# Patient Record
Sex: Female | Born: 1945 | Race: White | Hispanic: No | Marital: Married | Smoking: Former smoker
Health system: Southern US, Community
[De-identification: ages and names within clinical notes are randomized; demographics above are authoritative.]

## PROBLEM LIST (undated history)

## (undated) DIAGNOSIS — K219 Gastro-esophageal reflux disease without esophagitis: Secondary | ICD-10-CM

## (undated) DIAGNOSIS — E785 Hyperlipidemia, unspecified: Secondary | ICD-10-CM

## (undated) DIAGNOSIS — I471 Supraventricular tachycardia, unspecified: Secondary | ICD-10-CM

## (undated) DIAGNOSIS — I10 Essential (primary) hypertension: Secondary | ICD-10-CM

## (undated) DIAGNOSIS — K449 Diaphragmatic hernia without obstruction or gangrene: Secondary | ICD-10-CM

## (undated) DIAGNOSIS — E039 Hypothyroidism, unspecified: Secondary | ICD-10-CM

## (undated) HISTORY — DX: Supraventricular tachycardia, unspecified: I47.10

## (undated) HISTORY — PX: ABLATION: SHX5711

## (undated) HISTORY — DX: Supraventricular tachycardia: I47.1

## (undated) HISTORY — DX: Hypothyroidism, unspecified: E03.9

## (undated) HISTORY — DX: Hyperlipidemia, unspecified: E78.5

## (undated) HISTORY — DX: Gastro-esophageal reflux disease without esophagitis: K21.9

## (undated) HISTORY — DX: Essential (primary) hypertension: I10

## (undated) HISTORY — DX: Diaphragmatic hernia without obstruction or gangrene: K44.9

## (undated) HISTORY — PX: OTHER SURGICAL HISTORY: SHX169

## (undated) HISTORY — PX: CATARACT EXTRACTION: SUR2

---

## 2008-04-22 ENCOUNTER — Encounter: Payer: Self-pay | Admitting: Family Medicine

## 2008-04-28 ENCOUNTER — Ambulatory Visit: Payer: Self-pay | Admitting: Family Medicine

## 2008-04-28 DIAGNOSIS — K219 Gastro-esophageal reflux disease without esophagitis: Secondary | ICD-10-CM | POA: Insufficient documentation

## 2008-04-28 DIAGNOSIS — I10 Essential (primary) hypertension: Secondary | ICD-10-CM | POA: Insufficient documentation

## 2008-04-28 DIAGNOSIS — E039 Hypothyroidism, unspecified: Secondary | ICD-10-CM | POA: Insufficient documentation

## 2008-04-28 DIAGNOSIS — I959 Hypotension, unspecified: Secondary | ICD-10-CM | POA: Insufficient documentation

## 2008-04-29 ENCOUNTER — Encounter: Payer: Self-pay | Admitting: Family Medicine

## 2008-04-29 LAB — CONVERTED CEMR LAB
T3, Free: 3.2 pg/mL (ref 2.3–4.2)
TSH: 8.679 microintl units/mL — ABNORMAL HIGH (ref 0.350–4.50)
Thyroglobulin Ab: 46.3 (ref 0.0–60.0)

## 2008-05-19 ENCOUNTER — Ambulatory Visit: Payer: Self-pay | Admitting: Family Medicine

## 2008-05-19 DIAGNOSIS — M5416 Radiculopathy, lumbar region: Secondary | ICD-10-CM | POA: Insufficient documentation

## 2008-06-08 ENCOUNTER — Encounter: Payer: Self-pay | Admitting: Family Medicine

## 2008-06-09 ENCOUNTER — Encounter: Payer: Self-pay | Admitting: Family Medicine

## 2008-06-16 ENCOUNTER — Ambulatory Visit: Payer: Self-pay | Admitting: Family Medicine

## 2008-06-16 DIAGNOSIS — I471 Supraventricular tachycardia: Secondary | ICD-10-CM | POA: Insufficient documentation

## 2008-08-17 ENCOUNTER — Telehealth: Payer: Self-pay | Admitting: Family Medicine

## 2008-10-05 ENCOUNTER — Ambulatory Visit: Payer: Self-pay | Admitting: Family Medicine

## 2008-10-05 DIAGNOSIS — E785 Hyperlipidemia, unspecified: Secondary | ICD-10-CM | POA: Insufficient documentation

## 2008-10-07 ENCOUNTER — Encounter: Payer: Self-pay | Admitting: Family Medicine

## 2008-11-04 ENCOUNTER — Telehealth: Payer: Self-pay | Admitting: Family Medicine

## 2008-11-05 ENCOUNTER — Encounter: Payer: Self-pay | Admitting: Family Medicine

## 2008-11-06 LAB — CONVERTED CEMR LAB: LDL Cholesterol: 178 mg/dL — ABNORMAL HIGH (ref 0–99)

## 2008-12-23 ENCOUNTER — Ambulatory Visit: Payer: Self-pay | Admitting: Family Medicine

## 2009-04-20 ENCOUNTER — Ambulatory Visit: Payer: Self-pay | Admitting: Family Medicine

## 2009-04-20 LAB — CONVERTED CEMR LAB
Glucose, Urine, Semiquant: NEGATIVE
Ketones, urine, test strip: NEGATIVE
Urobilinogen, UA: 0.2

## 2009-04-21 ENCOUNTER — Encounter: Payer: Self-pay | Admitting: Family Medicine

## 2009-04-22 LAB — CONVERTED CEMR LAB
HDL: 56 mg/dL (ref >39)
Triglycerides: 231 mg/dL — ABNORMAL HIGH (ref <150)

## 2009-05-13 ENCOUNTER — Telehealth: Payer: Self-pay | Admitting: Family Medicine

## 2009-11-08 ENCOUNTER — Encounter: Payer: Self-pay | Admitting: Family Medicine

## 2009-12-13 ENCOUNTER — Ambulatory Visit: Payer: Self-pay | Admitting: Family Medicine

## 2010-01-05 ENCOUNTER — Ambulatory Visit: Payer: Self-pay | Admitting: Family Medicine

## 2010-01-06 LAB — CONVERTED CEMR LAB
CO2: 22 meq/L (ref 19–32)
Cholesterol: 204 mg/dL — ABNORMAL HIGH (ref 0–200)
Creatinine, Ser: 0.78 mg/dL (ref 0.40–1.20)
Glucose, Bld: 104 mg/dL — ABNORMAL HIGH (ref 70–99)
Sodium: 141 meq/L (ref 135–145)
Total Bilirubin: 0.5 mg/dL (ref 0.3–1.2)
Total Protein: 7.5 g/dL (ref 6.0–8.3)
Triglycerides: 167 mg/dL — ABNORMAL HIGH (ref <150)
VLDL: 33 mg/dL (ref 0–40)

## 2010-02-16 ENCOUNTER — Ambulatory Visit: Payer: Self-pay | Admitting: Family Medicine

## 2010-03-15 ENCOUNTER — Telehealth: Payer: Self-pay | Admitting: Family Medicine

## 2010-04-12 NOTE — Assessment & Plan Note (Signed)
Summary: shoulder pain, labs, HTN   Vital Signs:  Patient profile:   65 year old female Height:      58.4 inches Weight:      110 pounds Pulse rate:   55 / minute BP sitting:   158 / 66  (right arm) Cuff size:   regular  Vitals Entered By: Avon Gully CMA, Duncan Dull) (January 05, 2010 8:34 AM) CC: left shoulder pain x 2 weeks,sometimes hand gets numb,wants fasting bld work   Primary Care Provider:  [name] MD  CC:  left shoulder pain x 2 weeks, sometimes hand gets numb, and wants fasting bld work.  History of Present Illness: Left shoulder pain x 2 week, over the outside of the shoulder. Occ radiates towards neck or down the arm. Achy pain. 5-8/10. Worse if reaches back or upwards. When radiates down the arm her fingers feel numb and tingling.  Doesn't necessarrily wake up with the pain.  Pain is intermittant. Using naproxen- helps some.  No other tx.  She is worried this is a cholesterol problem. Not painful to sleep on that shoulder. No inciting events or trauma.   Cough, tickle in the back of the throat. Dry. NO other URI sxs. Occurs radomly.   Current Medications (verified): 1)  Nabumetone 750 Mg Tabs (Nabumetone) .... Take 1/2 Tablet By Mouth Once A Day 2)  Naproxen 500 Mg Tabs (Naproxen) .... Take 1 Tablet By Mouth Two Times A Day As Needed Pain 3)  Levothroid 50 Mcg Tabs (Levothyroxine Sodium) .... Take 1 Tablet By Mouth Once A Day in The Am, 1 Hour Before Breakfast. 4)  Omeprazole 40 Mg Cpdr (Omeprazole) .... Take 1 Tablet By Mouth Once A Day 5)  Aspirin 81 Mg Tbec (Aspirin) .... Take One Tablet By Mouth Once A Day 6)  Niaspan 1000 Mg Cr-Tabs (Niacin (Antihyperlipidemic)) .... Take 1 Tablet By Mouth Once A Day At Bedtime 7)  Digoxin 0.25 Mg Tabs (Digoxin) .... Take One-Half  Tablet By Mouth Once A Day 8)  Lisinopril 10 Mg Tabs (Lisinopril) .... Take 1 Tablet By Mouth Once A Day 9)  Simvastatin 40 Mg Tabs (Simvastatin) .... 1/2 Tab By Mouth At Bedtime, Then  Increase To Whole Tab.  Allergies (verified): 1)  Pravachol (Pravastatin Sodium)  Comments:  Nurse/Medical Assistant: The patient's medications and allergies were reviewed with the patient and were updated in the Medication and Allergy Lists. Avon Gully CMA, Duncan Dull) (January 05, 2010 8:36 AM)  Physical Exam  General:  Well-developed,well-nourished,in no acute distress; alert,appropriate and cooperative throughout examination Lungs:  Normal respiratory effort, chest expands symmetrically. Lungs are clear to auscultation, no crackles or wheezes. Heart:  Normal rate and regular rhythm. S1 and S2 normal without gallop, murmur, click, rub or other extra sounds. Msk:  Left shoulder with Nrom, BUT does have pain with full extension and reaching behind her back. Shoulder, elbow, wrist and thumb gsterngth 5/5 bilat.  Nontender elbow. TEnder over the biceps tendon and over tht superior edge of the clavicle and over the upper trap towards the neck.  Neck with NROM except dec extension.     Impression & Recommendations:  Problem # 1:  SHOULDER PAIN (ICD-719.41) Assessment New Conservative tx for possible bursitis for OA flare.  NSAID, icing, avoid heavy lifting. H.O given on exerciess to do.  Consider may be coming from the cervical spine since does have occ tinlgin in her hand.  The following medications were removed from the medication list:    Flexeril 10  Mg Tabs (Cyclobenzaprine hcl) .Marland Kitchen... Take 1 tablet by mouth once a day at bedtime as needed Her updated medication list for this problem includes:    Nabumetone 750 Mg Tabs (Nabumetone) .Marland Kitchen... Take 1/2 tablet by mouth once a day    Naproxen 500 Mg Tabs (Naproxen) .Marland Kitchen... Take 1 tablet by mouth two times a day as needed pain    Aspirin 81 Mg Tbec (Aspirin) .Marland Kitchen... Take one tablet by mouth once a day  Problem # 2:  ESSENTIAL HYPERTENSION, BENIGN (ICD-401.1) Assessment: Deteriorated Suspect an ACE cough so will change ACE to ARB. She will start  with half and inc to whole if her SBP is not under 140. F/U to recheck BP in 6 weeks and to make sure cough has resolved.  The following medications were removed from the medication list:    Diltiazem Hcl Er Beads 360 Mg Xr24h-cap (Diltiazem hcl er beads) .Marland Kitchen... Take one tablet by mouth once a day Her updated medication list for this problem includes:    Losartan Potassium 50 Mg Tabs (Losartan potassium) .Marland Kitchen... Take 1 tablet by mouth once a day  Orders: T-Lipid Profile ([id number]) T-Comprehensive Metabolic Panel ([id number])  Complete Medication List: 1)  Nabumetone 750 Mg Tabs (Nabumetone) .... Take 1/2 tablet by mouth once a day 2)  Naproxen 500 Mg Tabs (Naproxen) .... Take 1 tablet by mouth two times a day as needed pain 3)  Levothroid 50 Mcg Tabs (Levothyroxine sodium) .... Take 1 tablet by mouth once a day in the am, 1 hour before breakfast. 4)  Omeprazole 40 Mg Cpdr (Omeprazole) .... Take 1 tablet by mouth once a day 5)  Aspirin 81 Mg Tbec (Aspirin) .... Take one tablet by mouth once a day 6)  Niaspan 1000 Mg Cr-tabs (Niacin (antihyperlipidemic)) .... Take 1 tablet by mouth once a day at bedtime 7)  Digoxin 0.25 Mg Tabs (Digoxin) .... Take one-half  tablet by mouth once a day 8)  Losartan Potassium 50 Mg Tabs (Losartan potassium) .... Take 1 tablet by mouth once a day 9)  Simvastatin 40 Mg Tabs (Simvastatin) .... 1/2 tab by mouth at bedtime, then increase to whole tab.  Other Orders: T-TSH [id number])  Patient Instructions: 1)  Exercises for your shoulder and continue the naproxen two times a day with food for your shoulder.  If not better in 2-3 weeks then let me know.  2)  Can start the losartan 1/2 tab daily.  3)  Please schedule a follow-up appointment in 6 weeks for blood pressure.  Prescriptions: LOSARTAN POTASSIUM 50 MG TABS (LOSARTAN POTASSIUM) Take 1 tablet by mouth once a day  #30 x 1   Entered and Authorized by:   [name] MD   Signed by:   [name] MD on 01/05/2010   Method used:   Electronically to        [facility] [phone]* (retail)       [address] [place], [place]  [address]       Ph: [phone]       Fax: [phone]   RxID:   [id number] LOSARTAN POTASSIUM 25 MG TABS (LOSARTAN POTASSIUM) Take 1 tablet by mouth once a day  #30 x 2   Entered and Authorized by:   [name] MD   Signed by:   [name] MD on 01/05/2010   Method used:   Electronically to        [facility] [phone]* (  retail)       [address] [place], [place]  [address]       Ph: [phone]       Fax: [phone]   RxID:   [id number]    Orders Added: 1)  T-Lipid Profile [id number] 2)  T-Comprehensive Metabolic Panel [[id number]] 3)  T-TSH [[id number]] 4)  Est. Patient Level IV [34742]

## 2010-04-12 NOTE — Progress Notes (Signed)
Summary: APPT CHANGE?  Phone Note Call from Patient   Summary of Call: Hi Dr Linford Arnold, pt Jessica Dawson [phone]) called, she said at her last appt you told her that if she made an appt with her cardiologist that she would not need to keep her March 8th appt with you. She has made an appt with the cardiologist for March 29th. Does she still need to keep her 3/8 appt? She is still taking 1/2 of her pill and has been monitoring her blood pressure. All of her readings have been good, this mornings' 126 64 but they've been averaging 105 44. Thanks,Diane  Follow-up for Phone Call        Since BPs have been OK, then can wait until see cards.  Follow-up by: [name] MD,  May 13, 2009 9:44 AM  Additional Follow-up for Phone Call Additional follow up Details #1::        Pt informed of above.  Appt canceled.  Additional Follow-up by: Lannette Donath,  May 14, 2009 12:52 PM

## 2010-04-12 NOTE — Assessment & Plan Note (Signed)
Summary: FLU SHOT  Nurse Visit   Allergies: 1)  Pravachol (Pravastatin Sodium)  Immunizations Administered:  Influenza Vaccine # 1:    Vaccine Type: Fluvax 3+    Site: left deltoid    Mfr: Gl[employer]    Dose: 0.5 ml    Route: IM    Given by: Sue Lush McCrimmon CMA, (AAMA)    Exp. Date: 09/10/2010    Lot #: [id number]    VIS given: 10/05/09 version given December 13, 2009.  Flu Vaccine Consent Questions:    Do you have a history of severe allergic reactions to this vaccine? no    Any prior history of allergic reactions to egg and/or gelatin? no    Do you have a sensitivity to the preservative Thimersol? no    Do you have a past history of Guillan-Barre Syndrome? no    Do you currently have an acute febrile illness? no    Have you ever had a severe reaction to latex? no    Vaccine information given and explained to patient? no    Are you currently pregnant? no  Orders Added: 1)  Flu Vaccine 107yrs + [90658] 2)  Admin 1st Vaccine [44010]

## 2010-04-12 NOTE — Letter (Signed)
Summary: [facility]   Imported By: Lanelle Bal 11/18/2009 09:40:23  _____________________________________________________________________  External Attachment:    Type:   Image     Comment:   External Document

## 2010-04-12 NOTE — Assessment & Plan Note (Signed)
Summary: HTN, dysuria   Vital Signs:  Patient profile:   65 year old female Height:      58.4 inches Weight:      114.25 pounds BMI:     23.64 Temp:     97.7 degrees F oral Pulse rate:   69 / minute BP sitting:   165 / 71  Vitals Entered By: Kandice Hams (April 20, 2009 10:09 AM) CC: FOLLOWUP;  PT C/O STRONG URINE, Hypertension Management   Primary Care Provider:  [name] MD  CC:  FOLLOWUP;  PT C/O STRONG URINE and Hypertension Management.  History of Present Illness: FOLLOWUP HTN;  PT C/O STRONG URINE.  Urine wtih strong odor and dark color for about a month. No dysuria or hematuria.  No fever or back pain.   Hypertension History:      She denies headache, chest pain, palpitations, dyspnea with exertion, orthopnea, PND, peripheral edema, visual symptoms, neurologic problems, syncope, and side effects from treatment.  Headaches are much better on half a dose of digoxin. Saw her cardiologist about a month ago and he decreased her digoxin. Marland Kitchen        Positive major cardiovascular risk factors include female age 79 years old or older, hyperlipidemia, and hypertension.  Negative major cardiovascular risk factors include negative family history for ischemic heart disease and non-tobacco-user status.     Allergies: 1)  Pravachol (Pravastatin Sodium)  Physical Exam  General:  Well-developed,well-nourished,in no acute distress; alert,appropriate and cooperative throughout examination Head:  Normocephalic and atraumatic without obvious abnormalities. No apparent alopecia or balding. Eyes:  No corneal or conjunctival inflammation noted. EOMI. Perrla. Lungs:  Normal respiratory effort, chest expands symmetrically. Lungs are clear to auscultation, no crackles or wheezes. Heart:  Normal rate and regular rhythm. S1 and S2 normal without gallop, murmur, click, rub or other extra sounds.  No carotid bruits.  Skin:  no rashes.   Cervical Nodes:  No lymphadenopathy noted Psych:   Cognition and judgment appear intact. Alert and cooperative with normal attention span and concentration. No apparent delusions, illusions, hallucinations   Impression & Recommendations:  Problem # 1:  ESSENTIAL HYPERTENSION, BENIGN (ICD-401.1) Will add an ACEi to her regimen for better control. Warned of potential SE. Call if any concerns. Says her BPs are always high at the MD office.  Check BP at home and bring in reading in one month.  Her updated medication list for this problem includes:    Diltiazem Hcl Er Beads 360 Mg Xr24h-cap (Diltiazem hcl er beads) .Marland Kitchen... Take one tablet by mouth once a day    Lisinopril 10 Mg Tabs (Lisinopril) .Marland Kitchen... Take 1 tablet by mouth once a day  Problem # 2:  DYSURIA (ICD-788.1) UA on ly + for LE. Will send cx and hold off an ABX for now. Needs to increase fluds. It really sounds like she doesn't drink much at all during the day.  Orders: T-Urine Culture (Spectrum Order) [id number])  Complete Medication List: 1)  Nabumetone 750 Mg Tabs (Nabumetone) .... Take 1/2 tablet by mouth once a day 2)  Flexeril 10 Mg Tabs (Cyclobenzaprine hcl) .... Take 1 tablet by mouth once a day at bedtime as needed 3)  Naproxen 500 Mg Tabs (Naproxen) .... Take 1 tablet by mouth two times a day as needed pain 4)  Levothroid 50 Mcg Tabs (Levothyroxine sodium) .... Take 1 tablet by mouth once a day in the am, 1 hour before breakfast. 5)  Omeprazole 40 Mg Cpdr (Omeprazole) .Marland KitchenMarland KitchenMarland Kitchen  Take 1 tablet by mouth once a day 6)  Aspirin 81 Mg Tbec (Aspirin) .... Take one tablet by mouth once a day 7)  Niaspan 1000 Mg Cr-tabs (Niacin (antihyperlipidemic)) .... Take 1 tablet by mouth once a day at bedtime 8)  Digoxin 0.25 Mg Tabs (Digoxin) .... Take one-half  tablet by mouth once a day 9)  Diltiazem Hcl Er Beads 360 Mg Xr24h-cap (Diltiazem hcl er beads) .... Take one tablet by mouth once a day 10)  Lisinopril 10 Mg Tabs (Lisinopril) .... Take 1 tablet by mouth once a day  Other  Orders: Prescription Created Electronically [id number]) T-Lipid Profile [id number])  Hypertension Assessment/Plan:      The patient's hypertensive risk group is category B: At least one risk factor (excluding diabetes) with no target organ damage.  Her calculated 10 year risk of coronary heart disease is 27 %.  Today's blood pressure is 165/71.  Her blood pressure goal is < 140/90. Prescriptions: LISINOPRIL 10 MG TABS (LISINOPRIL) Take 1 tablet by mouth once a day  #30 x 1   Entered and Authorized by:   [name] MD   Signed by:   [name] MD on 04/20/2009   Method used:   Electronically to        [facility] [phone]* (retail)       [address] [place], [place]  [address]       Ph: [phone]       Fax: [phone]   RxID:   [id number]   Laboratory Results   Urine Tests    Routine Urinalysis   Color: yellow Appearance: Clear Glucose: negative   (Normal Range: Negative) Bilirubin: negative   (Normal Range: Negative) Ketone: negative   (Normal Range: Negative) Spec. Gravity: 1.015   (Normal Range: 1.003-1.035) Blood: trace-intact   (Normal Range: Negative) pH: 7.5   (Normal Range: 5.0-8.0) Protein: negative   (Normal Range: Negative) Urobilinogen: 0.2   (Normal Range: 0-1) Nitrite: negative   (Normal Range: Negative) Leukocyte Esterace: trace   (Normal Range: Negative)

## 2010-04-12 NOTE — Assessment & Plan Note (Signed)
Summary: 6 week f/u HTN, lipids, UR   Vital Signs:  Patient profile:   65 year old female Height:      58.4 inches Weight:      114 pounds Pulse rate:   63 / minute BP sitting:   153 / 69  (right arm) Cuff size:   regular  Vitals Entered By: Avon Gully CMA, Duncan Dull) (February 16, 2010 8:19 AM) CC: f/u BP, Hypertension Management   Primary Care Provider:  [name] MD  CC:  f/u BP and Hypertension Management.  History of Present Illness: Overall cough is better but did get a cold 2 weeks ago. Has sinus congestion adn left ear pain. Overall feels better but wants me to check her ear. NO fever  Left shoulder is better.   Hypertension History:      She denies headache, chest pain, palpitations, dyspnea with exertion, orthopnea, PND, peripheral edema, visual symptoms, neurologic problems, syncope, and side effects from treatment.  She notes no problems with any antihypertensive medication side effects.        Positive major cardiovascular risk factors include female age 46 years old or older, hyperlipidemia, and hypertension.  Negative major cardiovascular risk factors include negative family history for ischemic heart disease and non-tobacco-user status.     Current Medications (verified): 1)  Naproxen 500 Mg Tabs (Naproxen) .... Take 1 Tablet By Mouth Two Times A Day As Needed Pain 2)  Levothroid 50 Mcg Tabs (Levothyroxine Sodium) .... Take 1 Tab By Mouth Once Daily 3)  Omeprazole 40 Mg Cpdr (Omeprazole) .... Take 1 Tablet By Mouth Once A Day 4)  Aspirin 81 Mg Tbec (Aspirin) .... Take One Tablet By Mouth Once A Day 5)  Digoxin 0.25 Mg Tabs (Digoxin) .... Take One-Half  Tablet By Mouth Once A Day 6)  Losartan Potassium 50 Mg Tabs (Losartan Potassium) .... Take 1 Tablet By Mouth Once A Day 7)  Simvastatin 40 Mg Tabs (Simvastatin) .... 1/2 Tab By Mouth At Bedtime, Then Increase To Whole Tab.  Allergies (verified): 1)  Pravachol (Pravastatin  Sodium)  Comments:  Nurse/Medical Assistant: The patient's medications and allergies were reviewed with the patient and were updated in the Medication and Allergy Lists. Avon Gully CMA, Duncan Dull) (February 16, 2010 8:20 AM)  Physical Exam  General:  Well-developed,well-nourished,in no acute distress; alert,appropriate and cooperative throughout examination Head:  Normocephalic and atraumatic without obvious abnormalities. No apparent alopecia or balding. Eyes:  No corneal or conjunctival inflammation noted. EOMI. Perrla. Ears:  External ear exam shows no significant lesions or deformities.  Otoscopic examination reveals clear canals, tympanic membranes are intact bilaterally without bulging, retraction, inflammation or discharge. Hearing is grossly normal bilaterally. Nose:  External nasal examination shows no deformity or inflammation. Nasal mucosa are pink and moist without lesions or exudates. Mouth:  Oral mucosa and oropharynx without lesions or exudates.  Teeth in good repair. Neck:  No deformities, masses, or tenderness noted. Lungs:  Normal respiratory effort, chest expands symmetrically. Lungs are clear to auscultation, no crackles or wheezes. Heart:  Normal rate and regular rhythm. S1 and S2 normal without gallop, murmur, click, rub or other extra sounds. Skin:  no rashes.   Cervical Nodes:  No lymphadenopathy noted Psych:  Cognition and judgment appear intact. Alert and cooperative with normal attention span and concentration. No apparent delusions, illusions, hallucinations   Impression & Recommendations:  Problem # 1:  ESSENTIAL HYPERTENSION, BENIGN (ICD-401.1) Cough is much improved off the ACE.  INcrease the losartan to  whole tab. F./U to recheck pressure in 2 months.  Her updated medication list for this problem includes:    Losartan Potassium 50 Mg Tabs (Losartan potassium) .Marland Kitchen... Take 1 tablet by mouth once a day  Problem # 2:  URI (ICD-465.9) Assessment:  Deteriorated REsolving. Exam is normal.  The following medications were removed from the medication list:    Nabumetone 750 Mg Tabs (Nabumetone) .Marland Kitchen... Take 1/2 tablet by mouth once a day Her updated medication list for this problem includes:    Naproxen 500 Mg Tabs (Naproxen) .Marland Kitchen... Take 1 tablet by mouth two times a day as needed pain    Aspirin 81 Mg Tbec (Aspirin) .Marland Kitchen... Take one tablet by mouth once a day  Problem # 3:  HYPERLIPIDEMIA (ICD-272.4) Will change to lipitor for better conrol. Await to Jan.  The following medications were removed from the medication list:    Niaspan 1000 Mg Cr-tabs (Niacin (antihyperlipidemic)) .Marland Kitchen... Take 1 tablet by mouth once a day at bedtime Her updated medication list for this problem includes:    Lipitor 40 Mg Tabs (Atorvastatin calcium) .Marland Kitchen... Take 1 tablet by mouth once a day at bedtime. please fill afterjan 1 2011  Complete Medication List: 1)  Naproxen 500 Mg Tabs (Naproxen) .... Take 1 tablet by mouth two times a day as needed pain 2)  Levothroid 50 Mcg Tabs (Levothyroxine sodium) .... Take 1 tab by mouth once daily 3)  Omeprazole 40 Mg Cpdr (Omeprazole) .... Take 1 tablet by mouth once a day 4)  Aspirin 81 Mg Tbec (Aspirin) .... Take one tablet by mouth once a day 5)  Digoxin 0.25 Mg Tabs (Digoxin) .... Take one-half  tablet by mouth once a day 6)  Losartan Potassium 50 Mg Tabs (Losartan potassium) .... Take 1 tablet by mouth once a day 7)  Lipitor 40 Mg Tabs (Atorvastatin calcium) .... Take 1 tablet by mouth once a day at bedtime. please fill afterjan 1 2011  Hypertension Assessment/Plan:      The patient's hypertensive risk group is category B: At least one risk factor (excluding diabetes) with no target organ damage.  Her calculated 10 year risk of coronary heart disease is 13 %.  Today's blood pressure is 153/69.  Her blood pressure goal is < 140/90.  Patient Instructions: 1)  Please schedule a follow-up appointment in 2 months to follow up  blood pessure and cholesterol.  Prescriptions: LIPITOR 40 MG TABS (ATORVASTATIN CALCIUM) Take 1 tablet by mouth once a day at bedtime. Please fill afterJan 1 2011  #30 x 2   Entered and Authorized by:   [name] MD   Signed by:   [name] MD on 02/16/2010   Method used:   Electronically to        [facility] [phone]* (retail)       [address] [place], [place]  [address]       Ph: [phone]       Fax: [phone]   RxID:   [id number] SIMVASTATIN 40 MG TABS (SIMVASTATIN) Take 1 tablet by mouth once a day  #90 x 1   Entered and Authorized by:   [name] MD   Signed by:   [name] MD on 02/16/2010   Method used:   Electronically to        [facility] [phone]* (retail)       [address] [place],  [place]  [address]       Ph: [phone]       Fax: [phone]   RxID:   [id number] VOZDGUYQ POTASSIUM 50 MG TABS (LOSARTAN POTASSIUM) Take 1 tablet by mouth once a day  #30 x 1   Entered and Authorized by:   [name] MD   Signed by:   [name] MD on 02/16/2010   Method used:   Electronically to        [facility] [phone]* (retail)       [address] [place], [place]  [address]       Ph: [phone]       Fax: [phone]   RxID:   [id number]    Orders Added: 1)  Est. Patient Level III [22025]

## 2010-04-14 NOTE — Progress Notes (Signed)
Summary: medicine  Phone Note Call from Patient Call back at Home Phone [phone]   Caller: Patient Summary of Call: Pt. called today and left a message stating that Dr. Linford Arnold told her she was going to call her in Lipitor or some kind of Cholesterol med and then when her husband went to get the med, it has not been called in.Marland KitchenMarland KitchenMarland KitchenPlease call her and let her know the status on this.Michaelle Copas  March 15, 2010 1:41 PM  Initial call taken by: Michaelle Copas,  March 15, 2010 1:41 PM  Follow-up for Phone Call        pt notified Follow-up by: Avon Gully CMA, Duncan Dull),  March 15, 2010 1:47 PM

## 2010-05-03 ENCOUNTER — Encounter: Payer: Self-pay | Admitting: Family Medicine

## 2010-05-17 ENCOUNTER — Encounter: Payer: Self-pay | Admitting: Family Medicine

## 2010-05-17 ENCOUNTER — Ambulatory Visit (INDEPENDENT_AMBULATORY_CARE_PROVIDER_SITE_OTHER): Payer: Medicare Other | Admitting: Family Medicine

## 2010-05-17 DIAGNOSIS — E785 Hyperlipidemia, unspecified: Secondary | ICD-10-CM

## 2010-05-17 DIAGNOSIS — E039 Hypothyroidism, unspecified: Secondary | ICD-10-CM

## 2010-05-17 DIAGNOSIS — I10 Essential (primary) hypertension: Secondary | ICD-10-CM

## 2010-05-18 ENCOUNTER — Encounter: Payer: Self-pay | Admitting: Family Medicine

## 2010-05-18 LAB — CONVERTED CEMR LAB
Albumin: 5.2 g/dL (ref 3.5–5.2)
Alkaline Phosphatase: 78 units/L (ref 39–117)
BUN: 16 mg/dL (ref 6–23)
CO2: 25 meq/L (ref 19–32)
Calcium: 10.2 mg/dL (ref 8.4–10.5)
Cholesterol: 204 mg/dL — ABNORMAL HIGH (ref 0–200)
Glucose, Bld: 105 mg/dL — ABNORMAL HIGH (ref 70–99)
HDL: 51 mg/dL (ref >39)
LDL Cholesterol: 111 mg/dL — ABNORMAL HIGH (ref 0–99)
Potassium: 4.7 meq/L (ref 3.5–5.3)
Triglycerides: 208 mg/dL — ABNORMAL HIGH (ref <150)

## 2010-05-24 NOTE — Assessment & Plan Note (Signed)
Summary: f/up- HTN, lipids   Vital Signs:  Patient profile:   65 year old female Height:      58.4 inches Weight:      113 pounds Pulse rate:   62 / minute BP sitting:   162 / 73  (right arm) Cuff size:   regular  Vitals Entered By: Avon Gully CMA, Duncan Dull) (May 17, 2010 9:13 AM) CC: f/u BP ,check toenails   Primary Care Provider:  [name] MD  CC:  f/u BP  and check toenails.  History of Present Illness: F/U BP.  HOme BPs 107-143/46-60.  No dizziness. No HA, noausea nd vomiting. Husband is in the hospital today. Says took her BP later today than usual.   NO weight gain or fatigue or skin changes.   Current Medications (verified): 1)  Naproxen 500 Mg Tabs (Naproxen) .... Take 1 Tablet By Mouth Two Times A Day As Needed Pain 2)  Levothroid 50 Mcg Tabs (Levothyroxine Sodium) .... Take 1 Tab By Mouth Once Daily 3)  Omeprazole 40 Mg Cpdr (Omeprazole) .... Take 1 Tablet By Mouth Once A Day 4)  Aspirin 81 Mg Tbec (Aspirin) .... Take One Tablet By Mouth Once A Day 5)  Digoxin 0.25 Mg Tabs (Digoxin) .... Take One-Half  Tablet By Mouth Once A Day 6)  Losartan Potassium 50 Mg Tabs (Losartan Potassium) .... Take 1 Tablet By Mouth Once A Day 7)  Lipitor 40 Mg Tabs (Atorvastatin Calcium) .... Take 1 Tablet By Mouth Once A Day At Bedtime. Please Fill Afterjan 1 2011 8)  Taztia Xt 180 Mg Xr24h-Cap (Diltiazem Hcl Er Beads) .... Take One Tablet By Mouth Two Times A Day  Allergies (verified): 1)  Pravachol (Pravastatin Sodium)  Comments:  Nurse/Medical Assistant: The patient's medications and allergies were reviewed with the patient and were updated in the Medication and Allergy Lists. 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  Married to [employer].  Former Smoker Alcohol use-no Drug use-no Regular exercise-yes  Physical Exam  General:  Well-developed,well-nourished,in no acute distress;  alert,appropriate and cooperative throughout examination Lungs:  Normal respiratory effort, chest expands symmetrically. Lungs are clear to auscultation, no crackles or wheezes. Heart:  Normal rate and regular rhythm. S1 and S2 normal without gallop, murmur, click, rub or other extra sounds.   Impression & Recommendations:  Problem # 1:  ESSENTIAL HYPERTENSION, BENIGN (ICD-401.1) Up today but home BPs look well controlled and we have measured her machine against our and it is accurate.  Her updated medication list for this problem includes:    Losartan Potassium 50 Mg Tabs (Losartan potassium) .Marland Kitchen... Take 1 tablet by mouth once a day    Taztia Xt 180 Mg Xr24h-cap (Diltiazem hcl er beads) .Marland Kitchen... Take one tablet by mouth two times a day  BP today: 162/73 Prior BP: 153/69 (02/16/2010)  Prior 10 Yr Risk Heart Disease: 13 % (02/16/2010)  Labs Reviewed: K+: 5.2 (01/05/2010) Creat: : 0.78 (01/05/2010)   Chol: 204 (01/05/2010)   HDL: 55 (01/05/2010)   LDL: 116 (01/05/2010)   TG: 167 (01/05/2010)  Orders: T-Comprehensive Metabolic Panel ([id number])  Problem # 2:  UNSPECIFIED HYPOTHYROIDISM (ICD-244.9) Due to recheck level and make sure at goal.   Her updated medication list for this problem includes:    Levothroid 50 Mcg Tabs (Levothyroxine sodium) .Marland Kitchen... Take 1 tab by mouth once daily  Labs Reviewed: TSH: 3.257 (01/05/2010)    Chol: 204 (01/05/2010)  HDL: 55 (01/05/2010)   LDL: 116 (01/05/2010)   TG: 167 (01/05/2010)  Orders: T-TSH ([id number])  Problem # 3:  HYPERLIPIDEMIA (ICD-272.4) Due to recheck on the lipitor to make sure at goal.  She is only taking a half a tab for cost savings.  Her updated medication list for this problem includes:    Lipitor 40 Mg Tabs (Atorvastatin calcium) .Marland Kitchen... Take 1 tablet by mouth once a day at bedtime. please fill afterjan 1 2011  Orders: T-Lipid Profile ([id number])  Complete Medication List: 1)  Naproxen 500 Mg Tabs (Naproxen) .... Take 1  tablet by mouth two times a day as needed pain 2)  Levothroid 50 Mcg Tabs (Levothyroxine sodium) .... Take 1 tab by mouth once daily 3)  Omeprazole 40 Mg Cpdr (Omeprazole) .... Take 1 tablet by mouth once a day 4)  Aspirin 81 Mg Tbec (Aspirin) .... Take one tablet by mouth once a day 5)  Digoxin 0.25 Mg Tabs (Digoxin) .... Take one-half  tablet by mouth once a day 6)  Losartan Potassium 50 Mg Tabs (Losartan potassium) .... Take 1 tablet by mouth once a day 7)  Lipitor 40 Mg Tabs (Atorvastatin calcium) .... Take 1 tablet by mouth once a day at bedtime. please fill afterjan 1 2011 8)  Taztia Xt 180 Mg Xr24h-cap (Diltiazem hcl er beads) .... Take one tablet by mouth two times a day  Patient Instructions: 1)  Please schedule a follow-up appointment in 4 months for blood pressure .    Orders Added: 1)  T-Comprehensive Metabolic Panel [[id number]] 2)  T-TSH [[id number]] 3)  T-Lipid Profile [[id number]] 4)  Est. Patient Level III [03474]

## 2010-05-31 NOTE — Letter (Signed)
Summary: [facility]   Imported By: Maryln Gottron 05/24/2010 12:44:20  _____________________________________________________________________  External Attachment:    Type:   Image     Comment:   External Document

## 2010-06-14 ENCOUNTER — Other Ambulatory Visit: Payer: Self-pay | Admitting: Family Medicine

## 2010-06-14 NOTE — Telephone Encounter (Signed)
Pt last seen 05/17/10 and will be due for BP follow up in July 2012.  Is it OK to refill Naprosyn.  Please advise.

## 2010-07-12 ENCOUNTER — Other Ambulatory Visit: Payer: Self-pay | Admitting: Family Medicine

## 2010-07-20 ENCOUNTER — Other Ambulatory Visit: Payer: Self-pay | Admitting: *Deleted

## 2010-09-26 ENCOUNTER — Other Ambulatory Visit: Payer: Self-pay | Admitting: Family Medicine

## 2010-09-26 NOTE — Telephone Encounter (Signed)
Pt needs a follow up within the next 30 days per last office visit note 02-16-2010.  To follow up for BP and cholesterol.

## 2010-10-18 ENCOUNTER — Other Ambulatory Visit: Payer: Self-pay | Admitting: Family Medicine

## 2010-10-20 ENCOUNTER — Other Ambulatory Visit: Payer: Self-pay | Admitting: Family Medicine

## 2010-10-20 NOTE — Telephone Encounter (Signed)
Prescription was sent on 10-18-10 for # 30.  Pt was suppose to have followed up in Feb 2012 for HTN.  Please have pt call office and sched an office visit for HTN.  No more refills until appt satisfied.

## 2010-10-20 NOTE — Telephone Encounter (Signed)
Pt calling saying her losartan prescription is not at the pharm and she needs refilled. Plan:  Told the pt I would call the pharm and check to see what the problem is since script was sent electronically on 10-18-10.  Called [facility] and the tech says they have the script filled and ready since 10-18-10.  Pharmacist will call the patient and let her know she can pup the script. Jarvis Newcomer, LPN [facility]

## 2010-10-26 ENCOUNTER — Encounter: Payer: Self-pay | Admitting: Family Medicine

## 2010-10-31 ENCOUNTER — Ambulatory Visit (INDEPENDENT_AMBULATORY_CARE_PROVIDER_SITE_OTHER): Payer: Medicare Other | Admitting: Family Medicine

## 2010-10-31 ENCOUNTER — Other Ambulatory Visit: Payer: Self-pay | Admitting: Family Medicine

## 2010-10-31 ENCOUNTER — Encounter: Payer: Self-pay | Admitting: Family Medicine

## 2010-10-31 VITALS — BP 164/72 | HR 65 | Wt 110.0 lb

## 2010-10-31 DIAGNOSIS — Z23 Encounter for immunization: Secondary | ICD-10-CM

## 2010-10-31 DIAGNOSIS — H9209 Otalgia, unspecified ear: Secondary | ICD-10-CM

## 2010-10-31 DIAGNOSIS — K120 Recurrent oral aphthae: Secondary | ICD-10-CM

## 2010-10-31 DIAGNOSIS — I10 Essential (primary) hypertension: Secondary | ICD-10-CM

## 2010-10-31 MED ORDER — MAGIC MOUTHWASH: 5.0000 mL | Freq: Four times a day (QID) | ORAL | Status: DC

## 2010-10-31 MED ORDER — AMBULATORY NON FORMULARY MEDICATION: Status: DC

## 2010-10-31 NOTE — Patient Instructions (Signed)
Keep meds the same.

## 2010-10-31 NOTE — Progress Notes (Signed)
  Subjective:    Patient ID: Jessica Dawson, female    DOB: 1945-07-07, 65 y.o.   MRN: [id number]  Hypertension This is a chronic problem. The current episode started more than 1 year ago. The problem is controlled. Pertinent negatives include no blurred vision, chest pain or shortness of breath. There are no associated agents to hypertension. Risk factors for coronary artery disease include no known risk factors. Past treatments include angiotensin blockers and calcium channel blockers. There are no compliance problems.    Says went to the beach recently abd got water in her ear and having intermittant left ear pain.  No drainage or fever. Had a URI recently.  Then broke wout eith a fever blister on her lip and then also blister on the lef side of tongue and instead of jaw. She has had before this is the first has been several years. No known triggers except for recent URI. She does have what she has a mild nasal congestion. No cough.   Review of Systems  Eyes: Negative for blurred vision.  Respiratory: Negative for shortness of breath.   Cardiovascular: Negative for chest pain.       Objective:   Physical Exam  Constitutional: She is oriented to person, place, and time. She appears well-developed and well-nourished.  HENT:  Head: Normocephalic and atraumatic.  Right Ear: External ear normal.  Left Ear: External ear normal.  Nose: Nose normal.  Mouth/Throat: Oropharynx is clear and moist.       TMs and canals are clear.   Eyes: Conjunctivae and EOM are normal. Pupils are equal, round, and reactive to light.  Neck: Neck supple. No thyromegaly present.  Cardiovascular: Normal rate, regular rhythm and normal heart sounds.   Pulmonary/Chest: Effort normal and breath sounds normal. She has no wheezes.  Lymphadenopathy:    She has no cervical adenopathy.  Neurological: She is alert and oriented to person, place, and time.  Skin: Skin is warm and dry.  Psychiatric: She has a normal mood and  affect.          Assessment & Plan:  Ear pain-I gave her reassurance as her exam is normal today. Call if it persists or suddenly gets worse.  Canker sore-prescription given for Magic mouthwash to use. She isn't improving in the next one to 2 weeks subsequently night. Avoid acidic foods that might irritate the lesion. An avoid alcohol based mouthwash as.

## 2010-10-31 NOTE — Assessment & Plan Note (Signed)
BP is up a little today but she has noticed some BPs occ in the upper 90s SBP at home so will continue current regimen and recheck in 3 months. Discussed really watching the salt in her diet.

## 2010-11-02 ENCOUNTER — Other Ambulatory Visit: Payer: Self-pay | Admitting: *Deleted

## 2010-11-16 ENCOUNTER — Other Ambulatory Visit: Payer: Self-pay | Admitting: Family Medicine

## 2010-11-22 ENCOUNTER — Other Ambulatory Visit: Payer: Self-pay | Admitting: Family Medicine

## 2010-11-22 NOTE — Telephone Encounter (Signed)
Pt needs to call office to schedule fasting lab appt and office appt for chol.

## 2011-01-10 ENCOUNTER — Other Ambulatory Visit: Payer: Self-pay | Admitting: Family Medicine

## 2011-01-25 ENCOUNTER — Ambulatory Visit (INDEPENDENT_AMBULATORY_CARE_PROVIDER_SITE_OTHER): Payer: Medicare Other | Admitting: Family Medicine

## 2011-01-25 ENCOUNTER — Encounter: Payer: Self-pay | Admitting: Family Medicine

## 2011-01-25 VITALS — BP 155/67 | HR 57 | Wt 115.0 lb

## 2011-01-25 DIAGNOSIS — Z1231 Encounter for screening mammogram for malignant neoplasm of breast: Secondary | ICD-10-CM

## 2011-01-25 DIAGNOSIS — K1370 Unspecified lesions of oral mucosa: Secondary | ICD-10-CM

## 2011-01-25 DIAGNOSIS — K121 Other forms of stomatitis: Secondary | ICD-10-CM

## 2011-01-25 DIAGNOSIS — I10 Essential (primary) hypertension: Secondary | ICD-10-CM

## 2011-01-25 MED ORDER — LOSARTAN POTASSIUM 100 MG PO TABS: 100.0000 mg | ORAL_TABLET | Freq: Every day | ORAL | Status: DC

## 2011-01-25 MED ORDER — PREDNISONE 20 MG PO TABS: 40.0000 mg | ORAL_TABLET | Freq: Every day | ORAL | Status: DC

## 2011-01-25 NOTE — Patient Instructions (Signed)
Can also get OTC Lysine which helps with mouth ulcers.

## 2011-01-25 NOTE — Progress Notes (Signed)
  Subjective:    Patient ID: Jessica Dawson, female    DOB: 22-Jun-1945, 65 y.o.   MRN: [id number]  Hypertension This is a chronic problem. The current episode started more than 1 year ago. The problem has been gradually improving since onset. The problem is uncontrolled. Pertinent negatives include no chest pain or shortness of breath. There are no associated agents to hypertension. Past treatments include angiotensin blockers and calcium channel blockers. There are no compliance problems.   BPs have been 108-150/53-70.  Still has ulcers on the left side of tongue.  Has been there for weeks and has not cleared up yet. Not as tender. Tried the magic mouthwash and helped sooth it but not really healing.  Had similar episodes years ago.    Review of Systems  Respiratory: Negative for shortness of breath.   Cardiovascular: Negative for chest pain.       Objective:   Physical Exam  Constitutional: She is oriented to person, place, and time. She appears well-developed and well-nourished.  HENT:  Head: Normocephalic and atraumatic.       She still has a group of about 3 ulcers on the left lateral side of her tongue.   Cardiovascular: Normal rate, regular rhythm and normal heart sounds.   Pulmonary/Chest: Effort normal and breath sounds normal.  Neurological: She is alert and oriented to person, place, and time.  Skin: Skin is warm and dry.  Psychiatric: She has a normal mood and affect. Her behavior is normal.          Assessment & Plan:  HTN- reviewing her numbers from her blood pressure log which is about half of her blood pressures are well controlled at about half are running in the upper one thirty-two over one 40s. I would like her to increase her to start into 100 mg and made it to bedtime. She currently takes her Cardizem and losartan the morning. I like her to followup in 4-6 weeks to recheck her blood pressure mixture to improving. If she starts feeling lightheaded or dizzy on the  increased dose of losartan and please give the office a call.  Mouth ulcers-I really expected to have improved by now. She has been continuing to use her Listerine mouthwash. I asked her to stop this for a week or 2 and see if it helps. It could be causing a persistent irritation to the area. Also the magic mouthwash was too expensive so she has not refilled it. He did try a round of steroids to see if this improves. I will give her 5 days worth. Also she can try over-the-counter lysine as well. If her symptoms are not resolving in the next 7-10 days and please give the office a call back.  I also recommend scheduling her for a mammogram. She did decline a colonoscopy but said she would do stool cards. We gave this to her today. Also she is to check with her insurance to see if they will cover the pneumonia vaccine.

## 2011-01-31 ENCOUNTER — Other Ambulatory Visit: Payer: Self-pay | Admitting: Family Medicine

## 2011-01-31 MED ORDER — HYDROCHLOROTHIAZIDE 12.5 MG PO CAPS: 12.5000 mg | ORAL_CAPSULE | Freq: Every day | ORAL | Status: DC

## 2011-01-31 NOTE — Telephone Encounter (Signed)
Will add a low dose fluid pill in the AM.

## 2011-01-31 NOTE — Telephone Encounter (Signed)
Pt.notified

## 2011-01-31 NOTE — Telephone Encounter (Signed)
Pt called and states the prednisone did help her blisters and they are gone. BP is still running high at150/57

## 2011-02-13 ENCOUNTER — Telehealth: Payer: Self-pay | Admitting: *Deleted

## 2011-02-13 NOTE — Telephone Encounter (Signed)
Pt states the new fluid pill is not working her BP was 140/64 p 62 this am 139/62 p70

## 2011-02-13 NOTE — Telephone Encounter (Signed)
Increase to 25mg  ( 2 tabs of the 12.5mg )  adn keep follow up appt.

## 2011-02-13 NOTE — Telephone Encounter (Signed)
Pt.notified

## 2011-02-15 ENCOUNTER — Other Ambulatory Visit: Payer: Self-pay | Admitting: Family Medicine

## 2011-02-22 ENCOUNTER — Other Ambulatory Visit: Payer: Self-pay | Admitting: *Deleted

## 2011-02-22 MED ORDER — HYDROCHLOROTHIAZIDE 12.5 MG PO CAPS: 12.5000 mg | ORAL_CAPSULE | Freq: Two times a day (BID) | ORAL | Status: DC

## 2011-02-28 ENCOUNTER — Encounter: Payer: Self-pay | Admitting: Family Medicine

## 2011-03-01 ENCOUNTER — Encounter: Payer: Self-pay | Admitting: Family Medicine

## 2011-03-01 ENCOUNTER — Ambulatory Visit (INDEPENDENT_AMBULATORY_CARE_PROVIDER_SITE_OTHER): Payer: Medicare Other | Admitting: Family Medicine

## 2011-03-01 DIAGNOSIS — R202 Paresthesia of skin: Secondary | ICD-10-CM

## 2011-03-01 DIAGNOSIS — E039 Hypothyroidism, unspecified: Secondary | ICD-10-CM

## 2011-03-01 DIAGNOSIS — R209 Unspecified disturbances of skin sensation: Secondary | ICD-10-CM

## 2011-03-01 DIAGNOSIS — I10 Essential (primary) hypertension: Secondary | ICD-10-CM

## 2011-03-01 NOTE — Progress Notes (Signed)
  Subjective:    Patient ID: Jessica Dawson, female    DOB: [birth year], 65 y.o.   MRN: [id number]  HPI Hypertension-she is doing very well on her blood pressure regimen. She is taking her hydrochlorothiazide and her diltiazem in the morning. She missed her losartan to bedtime. We did increase her heart for Dyazide to 2 tabs. She takes one first thing in the morning and one around lunchtime. She has been tracking her home blood pressures. We have verified that her cuff is accurate as she has probably been before. Her home blood pressures have all been consistently running under 135. She denies any side effects of medication. She has noticed some tingling in her fingertips. She says often changes hands and is not consistent. She wonders if it could be one of the blood pressure medications.  Hyperlipidemia-she has stopped her Lipitor-she was to be repeated muscle cramping especially in her right leg that was severe. She went to her Lipitor for a month to see if it helped. She has not had any episodes since. She says she plans to restart the Lipitor to give that another try before we consider changing medication. I think this is completely reasonable. If the cramping starts again and then we can change to a different statin.   Review of Systems     Objective:   Physical Exam  Constitutional: She is oriented to person, place, and time. She appears well-developed and well-nourished.  HENT:  Head: Normocephalic and atraumatic.  Cardiovascular: Normal rate, regular rhythm and normal heart sounds.   Pulmonary/Chest: Effort normal and breath sounds normal.  Neurological: She is alert and oriented to person, place, and time.  Skin: Skin is warm and dry.  Psychiatric: She has a normal mood and affect. Her behavior is normal.          Assessment & Plan:  Hypertension-her home blood pressure readings look fantastic and we have verified her home blood pressure cuff against our machine. She does also have  whitecoat hypertension as well. We will check a BMP to make sure that her electrolytes are normal since we increased her HCTZ 25 mg. We'll also check a TSH and a B12 and a CBC since she is having numbness and tingling in her fingertips.  Hypothyroid-we are due to recheck her level today.  Hyperlipidemia-she will restart her statin and see if the cramps recur. If they do then we will try a different statin.

## 2011-03-02 ENCOUNTER — Other Ambulatory Visit: Payer: Self-pay | Admitting: Family Medicine

## 2011-03-02 LAB — CBC WITH DIFFERENTIAL/PLATELET
Basophils Absolute: 0 10*3/uL (ref 0.0–0.1)
Basophils Relative: 1 % (ref 0–1)
MCHC: 32.4 g/dL (ref 30.0–36.0)
Neutro Abs: 3.9 10*3/uL (ref 1.7–7.7)
Neutrophils Relative %: 57 % (ref 43–77)
Platelets: 387 10*3/uL (ref 150–400)
RDW: 12.8 % (ref 11.5–15.5)

## 2011-03-02 LAB — VITAMIN B12: Vitamin B-12: 1525 pg/mL — ABNORMAL HIGH (ref 211–911)

## 2011-03-02 LAB — BASIC METABOLIC PANEL WITH GFR
CO2: 26 mEq/L (ref 19–32)
Calcium: 9.9 mg/dL (ref 8.4–10.5)
GFR, Est African American: >89 mL/min
Sodium: 138 mEq/L (ref 135–145)

## 2011-03-02 LAB — TSH: TSH: 4.331 u[IU]/mL (ref 0.350–4.500)

## 2011-05-01 ENCOUNTER — Other Ambulatory Visit: Payer: Self-pay | Admitting: Family Medicine

## 2011-05-08 ENCOUNTER — Telehealth: Payer: Self-pay | Admitting: Family Medicine

## 2011-05-08 ENCOUNTER — Encounter: Payer: Self-pay | Admitting: Family Medicine

## 2011-05-08 ENCOUNTER — Ambulatory Visit (INDEPENDENT_AMBULATORY_CARE_PROVIDER_SITE_OTHER): Payer: Medicare Other | Admitting: Family Medicine

## 2011-05-08 VITALS — BP 134/73 | HR 71 | Ht 59.5 in | Wt 110.0 lb

## 2011-05-08 DIAGNOSIS — Z1331 Encounter for screening for depression: Secondary | ICD-10-CM

## 2011-05-08 DIAGNOSIS — I10 Essential (primary) hypertension: Secondary | ICD-10-CM

## 2011-05-08 DIAGNOSIS — Z9181 History of falling: Secondary | ICD-10-CM

## 2011-05-08 DIAGNOSIS — K121 Other forms of stomatitis: Secondary | ICD-10-CM

## 2011-05-08 DIAGNOSIS — K1370 Unspecified lesions of oral mucosa: Secondary | ICD-10-CM

## 2011-05-08 MED ORDER — NAPROXEN 500 MG PO TABS: 500.0000 mg | ORAL_TABLET | Freq: Every day | ORAL | Status: DC | PRN

## 2011-05-08 MED ORDER — LOSARTAN POTASSIUM 100 MG PO TABS: 100.0000 mg | ORAL_TABLET | Freq: Every day | ORAL | Status: DC

## 2011-05-08 MED ORDER — LEVOTHYROXINE SODIUM 50 MCG PO TABS: 50.0000 ug | ORAL_TABLET | Freq: Every day | ORAL | Status: DC

## 2011-05-08 MED ORDER — HYDROCHLOROTHIAZIDE 12.5 MG PO CAPS: 12.5000 mg | ORAL_CAPSULE | Freq: Two times a day (BID) | ORAL | Status: DC

## 2011-05-08 NOTE — Telephone Encounter (Signed)
She did screen positive for depression on the questionnaire that I gave her. If she is interested in discussing it further at an office visit if she would like.

## 2011-05-08 NOTE — Progress Notes (Signed)
  Subjective:    Patient ID: Jessica Dawson, female    DOB: [birth year], 66 y.o.   MRN: [id number]  HPI HTN- Overall BP look great! A few high on the morning. She brought in a handwritten record of her blood pressures on daily basis. No chest pain or short of breath. She's been consistent with her medications. She would like some refills for 90 day supplies so that she can start to use her mail order and save money.  Blisters are back in her mouth- there on the left side of her tongue again. She said initially she thought she had some on the gums but they have resolved. She's been gargling with salt water. Did well with prednisone in the past.  She's not sure why she is getting them again and is worried about mouth cancer. She's not a smoker.   Review of Systems     Objective:   Physical Exam  Constitutional: She is oriented to person, place, and time. She appears well-developed and well-nourished.  HENT:  Head: Normocephalic and atraumatic.       She does have some white blisters on the left side of her tongue. Nothing on her gum line.  Cardiovascular: Normal rate, regular rhythm and normal heart sounds.   Pulmonary/Chest: Effort normal and breath sounds normal.  Neurological: She is alert and oriented to person, place, and time.  Skin: Skin is warm and dry.  Psychiatric: She has a normal mood and affect. Her behavior is normal.          Assessment & Plan:  HTN - controlled. Continue current regimen. Followup in 6 months. She is not due for labs until March.  Tongue Ulcers- I will refer her to dermatology. If they are unable to get her in sooner but it certainly treat with a course of steroids at this point in time I would like to not treat them so that they can actually see them.  Today we performed a fall risk assessment and a depression questionnaire screening.  Fall risk score was 1.  PHQ- 9 score was ( which is a + screen for mild depression.  Will have her schedule appt to  discuss further.

## 2011-05-09 ENCOUNTER — Other Ambulatory Visit: Payer: Self-pay | Admitting: Radiology

## 2011-05-09 DIAGNOSIS — Z1231 Encounter for screening mammogram for malignant neoplasm of breast: Secondary | ICD-10-CM

## 2011-05-09 NOTE — Telephone Encounter (Signed)
Left message to call back  

## 2011-05-12 NOTE — Telephone Encounter (Signed)
Left message on vm

## 2011-05-18 ENCOUNTER — Other Ambulatory Visit: Payer: Self-pay | Admitting: Family Medicine

## 2011-05-29 ENCOUNTER — Ambulatory Visit: Payer: Medicare Other | Admitting: Family Medicine

## 2011-08-19 DIAGNOSIS — H40039 Anatomical narrow angle, unspecified eye: Secondary | ICD-10-CM | POA: Insufficient documentation

## 2011-09-27 DIAGNOSIS — Z961 Presence of intraocular lens: Secondary | ICD-10-CM | POA: Insufficient documentation

## 2011-10-03 ENCOUNTER — Other Ambulatory Visit: Payer: Self-pay | Admitting: *Deleted

## 2011-10-03 MED ORDER — LEVOTHYROXINE SODIUM 50 MCG PO TABS: 50.0000 ug | ORAL_TABLET | Freq: Every day | ORAL | Status: DC

## 2011-10-30 ENCOUNTER — Ambulatory Visit (INDEPENDENT_AMBULATORY_CARE_PROVIDER_SITE_OTHER): Payer: Medicare Other | Admitting: Family Medicine

## 2011-10-30 ENCOUNTER — Encounter: Payer: Self-pay | Admitting: Family Medicine

## 2011-10-30 VITALS — BP 157/69 | HR 60 | Ht 59.5 in | Wt 111.0 lb

## 2011-10-30 DIAGNOSIS — R232 Flushing: Secondary | ICD-10-CM

## 2011-10-30 DIAGNOSIS — R252 Cramp and spasm: Secondary | ICD-10-CM

## 2011-10-30 DIAGNOSIS — N951 Menopausal and female climacteric states: Secondary | ICD-10-CM

## 2011-10-30 DIAGNOSIS — Z1231 Encounter for screening mammogram for malignant neoplasm of breast: Secondary | ICD-10-CM

## 2011-10-30 DIAGNOSIS — Z79899 Other long term (current) drug therapy: Secondary | ICD-10-CM

## 2011-10-30 DIAGNOSIS — Z23 Encounter for immunization: Secondary | ICD-10-CM

## 2011-10-30 DIAGNOSIS — E785 Hyperlipidemia, unspecified: Secondary | ICD-10-CM

## 2011-10-30 DIAGNOSIS — E039 Hypothyroidism, unspecified: Secondary | ICD-10-CM

## 2011-10-30 DIAGNOSIS — I10 Essential (primary) hypertension: Secondary | ICD-10-CM

## 2011-10-30 LAB — COMPLETE METABOLIC PANEL WITH GFR
BUN: 22 mg/dL (ref 6–23)
CO2: 28 mEq/L (ref 19–32)
Calcium: 10.4 mg/dL (ref 8.4–10.5)
Chloride: 101 mEq/L (ref 96–112)
Creat: 0.85 mg/dL (ref 0.50–1.10)
GFR, Est African American: 81 mL/min

## 2011-10-30 MED ORDER — HYDROCHLOROTHIAZIDE 12.5 MG PO CAPS: 12.5000 mg | ORAL_CAPSULE | Freq: Two times a day (BID) | ORAL | Status: DC

## 2011-10-30 MED ORDER — NAPROXEN 500 MG PO TABS: 500.0000 mg | ORAL_TABLET | Freq: Two times a day (BID) | ORAL | Status: DC

## 2011-10-30 NOTE — Progress Notes (Signed)
Subjective:    Patient ID: Jessica Dawson, female    DOB: 24-Jan-1946, 66 y.o.   MRN: [id number]  HPI HTN - Home BPs have been running 120-135.  No CP or SOB.  Has f/u with cardiology in the next month.    Hot flashes have been worse lately.  She has been post menopausal for over 6 years.    Right buttock pain - she's had this for several weeks if not a couple of months. She says it is sensitive to sit on the area. She takes naproxen once a day. She is to take it twice a day. She feels it's gotten worse in the last couple weeks. No trauma or injury or falls onto her.  She also complains of some occasional low back pain.  Right otalgia for several days. No drainage. She feels like a cold sore. No fever. No other cold symptoms. She's not taking anything for pain.  Hyperlipidemia-at last office as we discussed that she was having a lot of muscle cramping in her legs. She felt was due to her Lipitor. I had her stop it and she did notice that the cramps away. She still having a lot of arthritis and joint pains but the actual cramps have completely resolved. She also had similar cramping with pravastatin in the past. Review of Systems  BP 157/69  Pulse 60  Ht 4' 11.5" (1.511 m)  Wt 111 lb (50.349 kg)  BMI 22.04 kg/m2    Allergies  Allergen Reactions  . Lipitor (Atorvastatin) Other (See Comments)    Muscle cramps   . Pravastatin Sodium     REACTION: Myalgias    No past medical history on file.  No past surgical history on file.  History   Social History  . Marital Status: Married    Spouse Name: N/A    Number of Children: N/A  . Years of Education: N/A   Occupational History  . Not on file.   Social History Main Topics  . Smoking status: Former Games developer  . Smokeless tobacco: Not on file  . Alcohol Use: No  . Drug Use: No  . Sexually Active:    Other Topics Concern  . Not on file   Social History Narrative  . No narrative on file    Family History  Problem Relation  Age of Onset  . Heart disease Father   . Hyperlipidemia Sister   . Hypertension Sister     Outpatient Encounter Prescriptions as of 10/30/2011  Medication Sig Dispense Refill  . aspirin 81 MG tablet Take 81 mg by mouth daily.        . Calcium-Vitamin D (CALTRATE 600 PLUS-VIT D PO) Take by mouth.        . digoxin (LANOXIN) 0.25 MG tablet Take 250 mcg by mouth daily. 1/2 tablet one time daily       . diltiazem (TIAZAC) 180 MG 24 hr capsule Take 180 mg by mouth 2 (two) times daily.        . hydrochlorothiazide (MICROZIDE) 12.5 MG capsule Take 1 capsule (12.5 mg total) by mouth 2 (two) times daily.  180 capsule  2  . levothyroxine (SYNTHROID, LEVOTHROID) 50 MCG tablet Take 1 tablet (50 mcg total) by mouth daily.  90 tablet  1  . losartan (COZAAR) 100 MG tablet Take 1 tablet (100 mg total) by mouth daily.  90 tablet  3  . Multiple Vitamins-Minerals (CENTRUM SILVER PO) Take by mouth.        Marland Kitchen  naproxen (NAPROSYN) 500 MG tablet Take 1 tablet (500 mg total) by mouth 2 (two) times daily with a meal.  180 tablet  1  . omeprazole (PRILOSEC) 40 MG capsule TAKE ONE CAPSULE BY MOUTH EVERY DAY  30 capsule  2  . DISCONTD: hydrochlorothiazide (MICROZIDE) 12.5 MG capsule Take 1 capsule (12.5 mg total) by mouth 2 (two) times daily.  180 capsule  2  . DISCONTD: hydrochlorothiazide (MICROZIDE) 12.5 MG capsule TAKE ONE CAPSULE BY MOUTH TWICE DAILY  60 capsule  2  . DISCONTD: naproxen (NAPROSYN) 500 MG tablet Take 1 tablet (500 mg total) by mouth daily as needed.  90 tablet  1  . DISCONTD: atorvastatin (LIPITOR) 40 MG tablet TAKE ONE TABLET BY MOUTH EVERY DAY AT BEDTIME  30 tablet  3  . DISCONTD: Cyanocobalamin (B-12 PO) Take by mouth.                Objective:   Physical Exam  Constitutional: She is oriented to person, place, and time. She appears well-developed and well-nourished.  HENT:  Head: Normocephalic and atraumatic.  Right Ear: External ear normal.  Mouth/Throat: Oropharynx is clear and moist.        Right TM an canal are clear.   Neck: Neck supple. No thyromegaly present.       Small right anterior cerv LN.   Cardiovascular: Normal rate, regular rhythm and normal heart sounds.   Pulmonary/Chest: Effort normal and breath sounds normal.  Musculoskeletal:       Right trochanteric bursa is nontender. She is tender over the right ischium. Normal strength of the right hip. Nontender over the lumbar spine. She's slightly tender over both SI joints.  Lymphadenopathy:    She has cervical adenopathy.  Neurological: She is alert and oriented to person, place, and time.  Skin: Skin is warm and dry.  Psychiatric: She has a normal mood and affect. Her behavior is normal.          Assessment & Plan:  HTN- Doing well on current regimen. Home blood pressures are well controlled. She does have a history of whitecoat hypertension. Due for CMP and digoxin level. She has followup with cardiology in one to 2 months. Last potassium was normal.  Hot flashes - am not sure why she's had a recent surgeon increase in her hot flashes. She's been postmenopausal for 10 years. We will check hormone levels today. Also check her thyroid as she is hypothyroid. Her last TSH was in December but was normal. Avoid caffeine intake.  Hypothyroidism-recheck TSH today. Make sure at goal.  Right ischia bursitis - discussed Ms. likely diagnosis based on her exam today. Recommend increasing her naproxen to twice a day. Using her cushion that she already has at home to sit on. Avoid sitting for prolonged periods. Consider steroid injection if she's not improving in the next 3-4 weeks.  She is due for screening mammogram and bone density. Order placed for both.  Right otalgia-exam is completely benign today. May be from a change in barometric pressure. This likely eustachian tube dysfunction. Consider nasal steroid if not improving. Asked to have a low threshold to call she feels it's getting worse or she runs a fever or  notices any drainage.  Hyperlipidemia-we will try the Livalo since she has not tolerated simvastatin or pravastatin. Samples given. Call if working well we can send prescription to the pharmacy. We can recheck her cholesterol at that point in time.

## 2011-10-31 LAB — DIGOXIN LEVEL: Digoxin Level: 1.2 ng/mL (ref 0.8–2.0)

## 2011-10-31 LAB — LUTEINIZING HORMONE: LH: 32.1 m[IU]/mL

## 2011-10-31 LAB — ESTRADIOL: Estradiol: <11.8 pg/mL

## 2011-11-21 ENCOUNTER — Ambulatory Visit (INDEPENDENT_AMBULATORY_CARE_PROVIDER_SITE_OTHER): Payer: Medicare Other

## 2011-11-21 DIAGNOSIS — M810 Age-related osteoporosis without current pathological fracture: Secondary | ICD-10-CM

## 2011-11-21 DIAGNOSIS — N951 Menopausal and female climacteric states: Secondary | ICD-10-CM

## 2011-11-21 DIAGNOSIS — Z1231 Encounter for screening mammogram for malignant neoplasm of breast: Secondary | ICD-10-CM

## 2011-11-27 ENCOUNTER — Encounter: Payer: Self-pay | Admitting: Family Medicine

## 2011-11-27 ENCOUNTER — Ambulatory Visit (INDEPENDENT_AMBULATORY_CARE_PROVIDER_SITE_OTHER): Payer: Medicare Other | Admitting: Family Medicine

## 2011-11-27 ENCOUNTER — Other Ambulatory Visit: Payer: Self-pay | Admitting: Family Medicine

## 2011-11-27 VITALS — BP 143/59 | HR 69 | Wt 112.0 lb

## 2011-11-27 DIAGNOSIS — M76899 Other specified enthesopathies of unspecified lower limb, excluding foot: Secondary | ICD-10-CM

## 2011-11-27 DIAGNOSIS — I10 Essential (primary) hypertension: Secondary | ICD-10-CM

## 2011-11-27 DIAGNOSIS — E785 Hyperlipidemia, unspecified: Secondary | ICD-10-CM

## 2011-11-27 DIAGNOSIS — Z23 Encounter for immunization: Secondary | ICD-10-CM

## 2011-11-27 DIAGNOSIS — M7070 Other bursitis of hip, unspecified hip: Secondary | ICD-10-CM

## 2011-11-27 NOTE — Progress Notes (Signed)
  Subjective:    Patient ID: Jessica Dawson, female    DOB: 05-Apr-1945, 66 y.o.   MRN: [id number]  HPI HTN - does have white coat hypertension.   - Home BPs are well controlled.    Ishcial bursitis is some better.  Using her naproxen.  She says it is progressing in the right direction. She still gets occasional stiffness behind both knees.  Hyperlipidemia-she has tried the samples of Livalo has been cutting in half. Overall she is tolerated it fairly well but feels it may be giving her constipation. She says too that with a naproxen. But she was taking the naproxen well before she started the Livalo was not having problems. She also notes some spasms in her hip muscles.   Review of Systems     Objective:   Physical Exam  Constitutional: She is oriented to person, place, and time. She appears well-developed and well-nourished.  HENT:  Head: Normocephalic and atraumatic.  Cardiovascular: Normal rate and regular rhythm.        2/6 SEM. Radial pulse 2+  Pulmonary/Chest: Effort normal and breath sounds normal.  Neurological: She is alert and oriented to person, place, and time.  Skin: Skin is warm and dry.  Psychiatric: She has a normal mood and affect. Her behavior is normal.          Assessment & Plan:  Hyperlidemia - continue with the Livalo and recheck fasting lipid panel and CMP today. We may need to discontinue it is causing sp,e constipation. Certainly she could stop it and see if the constipation is relieved. She is already intolerant to pravastatin. I was hoping that this will work well for her. Continue with healthy low-fat diet and regular exercise.  Ischial bursitis - Improving.  Will continue to monitor.    HTN - well controlled at home. Continue current regimen. Followup in 4 months.  Flu vaccine given today.

## 2011-11-29 LAB — COMPLETE METABOLIC PANEL WITH GFR
ALT: 18 U/L (ref 0–35)
AST: 21 U/L (ref 0–37)
Alkaline Phosphatase: 76 U/L (ref 39–117)
Creat: 0.75 mg/dL (ref 0.50–1.10)
GFR, Est African American: >89 mL/min
Sodium: 139 mEq/L (ref 135–145)
Total Bilirubin: 0.5 mg/dL (ref 0.3–1.2)
Total Protein: 7.3 g/dL (ref 6.0–8.3)

## 2011-11-29 LAB — LIPID PANEL
HDL: 45 mg/dL (ref >39)
Total CHOL/HDL Ratio: 5.8 Ratio
Triglycerides: 412 mg/dL — ABNORMAL HIGH (ref <150)

## 2011-11-30 ENCOUNTER — Telehealth: Payer: Self-pay | Admitting: *Deleted

## 2011-11-30 NOTE — Telephone Encounter (Signed)
Dr. Linford Arnold could you please review you message and advise Sue Lush called her but also wanted to verify this message.

## 2011-12-05 DIAGNOSIS — Z9849 Cataract extraction status, unspecified eye: Secondary | ICD-10-CM | POA: Insufficient documentation

## 2011-12-11 ENCOUNTER — Telehealth: Payer: Self-pay | Admitting: *Deleted

## 2011-12-11 MED ORDER — HYDROCHLOROTHIAZIDE 12.5 MG PO CAPS: 12.5000 mg | ORAL_CAPSULE | Freq: Two times a day (BID) | ORAL | Status: DC

## 2011-12-12 ENCOUNTER — Other Ambulatory Visit: Payer: Self-pay | Admitting: Family Medicine

## 2011-12-12 DIAGNOSIS — M810 Age-related osteoporosis without current pathological fracture: Secondary | ICD-10-CM | POA: Insufficient documentation

## 2011-12-12 DIAGNOSIS — R799 Abnormal finding of blood chemistry, unspecified: Secondary | ICD-10-CM

## 2011-12-13 ENCOUNTER — Other Ambulatory Visit: Payer: Self-pay | Admitting: Family Medicine

## 2011-12-14 LAB — VITAMIN B12: Vitamin B-12: 973 pg/mL — ABNORMAL HIGH (ref 211–911)

## 2011-12-14 LAB — VITAMIN D 25 HYDROXY (VIT D DEFICIENCY, FRACTURES): Vit D, 25-Hydroxy: 42 ng/mL (ref 30–89)

## 2011-12-15 ENCOUNTER — Other Ambulatory Visit: Payer: Self-pay | Admitting: Family Medicine

## 2011-12-15 MED ORDER — ALENDRONATE SODIUM 70 MG PO TABS: 70.0000 mg | ORAL_TABLET | ORAL | Status: DC

## 2012-04-01 ENCOUNTER — Telehealth: Payer: Self-pay | Admitting: Family Medicine

## 2012-04-01 ENCOUNTER — Ambulatory Visit (INDEPENDENT_AMBULATORY_CARE_PROVIDER_SITE_OTHER): Payer: Medicare Other | Admitting: Family Medicine

## 2012-04-01 ENCOUNTER — Encounter: Payer: Self-pay | Admitting: Family Medicine

## 2012-04-01 VITALS — BP 150/74 | HR 64 | Resp 18 | Wt 111.0 lb

## 2012-04-01 DIAGNOSIS — I10 Essential (primary) hypertension: Secondary | ICD-10-CM

## 2012-04-01 DIAGNOSIS — R0789 Other chest pain: Secondary | ICD-10-CM

## 2012-04-01 DIAGNOSIS — K140 Glossitis: Secondary | ICD-10-CM

## 2012-04-01 DIAGNOSIS — R1013 Epigastric pain: Secondary | ICD-10-CM

## 2012-04-01 LAB — CBC
HCT: 38.4 % (ref 36.0–46.0)
MCH: 29.6 pg (ref 26.0–34.0)
MCV: 88.1 fL (ref 78.0–100.0)
Platelets: 360 10*3/uL (ref 150–400)
RBC: 4.36 MIL/uL (ref 3.87–5.11)
WBC: 7.5 10*3/uL (ref 4.0–10.5)

## 2012-04-01 LAB — COMPLETE METABOLIC PANEL WITH GFR
ALT: 17 U/L (ref 0–35)
AST: 22 U/L (ref 0–37)
CO2: 26 mEq/L (ref 19–32)
Calcium: 10.1 mg/dL (ref 8.4–10.5)
Chloride: 100 mEq/L (ref 96–112)
Creat: 0.8 mg/dL (ref 0.50–1.10)
GFR, Est African American: 88 mL/min
Sodium: 139 mEq/L (ref 135–145)
Total Bilirubin: 0.4 mg/dL (ref 0.3–1.2)
Total Protein: 7.6 g/dL (ref 6.0–8.3)

## 2012-04-01 NOTE — Telephone Encounter (Signed)
Call pt: F/u in 1 mo for BP check.

## 2012-04-01 NOTE — Patient Instructions (Signed)
Increase omeprazole to twice a day.  Stop all soda.  Cut back on your coffee   Diet for Gastroesophageal Reflux Disease, Adult Reflux (acid reflux) is when acid from your stomach flows up into the esophagus. When acid comes in contact with the esophagus, the acid causes irritation and soreness (inflammation) in the esophagus. When reflux happens often or so severely that it causes damage to the esophagus, it is called gastroesophageal reflux disease (GERD). Nutrition therapy can help ease the discomfort of GERD. FOODS OR DRINKS TO AVOID OR LIMIT  Smoking or chewing tobacco. Nicotine is one of the most potent stimulants to acid production in the gastrointestinal tract.   Caffeinated and decaffeinated coffee and black tea.   Regular or low-calorie carbonated beverages or energy drinks (caffeine-free carbonated beverages are allowed).     Strong spices, such as black pepper, white pepper, red pepper, cayenne, curry powder, and chili powder.   Peppermint or spearmint.   Chocolate.   High-fat foods, including meats and fried foods. Extra added fats including oils, butter, salad dressings, and nuts. Limit these to less than 8 tsp per day.   Fruits and vegetables if they are not tolerated, such as citrus fruits or tomatoes.   Alcohol.   Any food that seems to aggravate your condition.  If you have questions regarding your diet, call your caregiver or a registered dietitian. OTHER THINGS THAT MAY HELP GERD INCLUDE:    Eating your meals slowly, in a relaxed setting.   Eating 5 to 6 small meals per day instead of 3 large meals.   Eliminating food for a period of time if it causes distress.   Not lying down until 3 hours after eating a meal.   Keeping the head of your bed raised 6 to 9 inches (15 to 23 cm) by using a foam wedge or blocks under the legs of the bed. Lying flat may make symptoms worse.   Being physically active. Weight loss may be helpful in reducing reflux in overweight  or obese adults.   Wear loose fitting clothing  EXAMPLE MEAL PLAN This meal plan is approximately 2,000 calories based on https://www.bernard.org/ meal planning guidelines. Breakfast   cup cooked oatmeal.   1 cup strawberries.   1 cup low-fat milk.   1 oz almonds.  Snack  1 cup cucumber slices.   6 oz yogurt (made from low-fat or fat-free milk).  Lunch  2 slice whole-wheat bread.   2 oz sliced [place].   2 tsp mayonnaise.   1 cup blueberries.   1 cup snap peas.  Snack  6 whole-wheat crackers.   1 oz string cheese.  Dinner   cup brown rice.   1 cup mixed veggies.   1 tsp olive oil.   3 oz grilled fish.  Document Released: 02/27/2005 Document Revised: 05/22/2011 Document Reviewed: 01/13/2011 [facility] Patient Information 2013 [place], [place].

## 2012-04-01 NOTE — Progress Notes (Signed)
Subjective:    Patient ID: Jessica Dawson, female    DOB: [birth year], 67 y.o.   MRN: [id number]  HPI HTN-  Pt denies SOB, dizziness, or heart palpitations.  Taking meds as directed w/o problems.  Denies medication side effects.  5 min spent with pt. she brought a log of home blood pressures. Most of them have been running from about 115 to 140. There couple of outliers and a couple of low blood pressures with systolic was under 100.  Did have some CP yesterday. Says stared in her lower chest mid sternal and then moved into her epigastrum. Says it felt like an ache.  Then later noticed an acid burning sensation in her throat.  Some burping.  Denies any actual heartburn. Has happened before but not this bad. Lasted a about 4 hours.  Has the blisters on her tongue again. Has been told it was a yeast infection. Takes her omeprazole daily.  No major dietary changes recently. No changes to her medications recently..  Drinks maybe one soda a day. Drinking about 2 cups of coffee a day.  No recent changes in her bowels. She denies any nausea with the episodes. No blood in her stool. No vomiting.    Review of Systems     BP 150/74  Pulse 64  Resp 18  Wt 111 lb (50.349 kg)  SpO2 99%    Allergies  Allergen Reactions  . Lipitor (Atorvastatin) Other (See Comments)    Muscle cramps   . Livalo (Pitavastatin) Other (See Comments)    constipation  . Pravastatin Sodium Other (See Comments)    REACTION: Myalgias    No past medical history on file.  No past surgical history on file.  History   Social History  . Marital Status: Married    Spouse Name: N/A    Number of Children: N/A  . Years of Education: N/A   Occupational History  . Not on file.   Social History Main Topics  . Smoking status: Former Games developer  . Smokeless tobacco: Not on file  . Alcohol Use: No  . Drug Use: No  . Sexually Active:    Other Topics Concern  . Not on file   Social History Narrative  . No narrative on file     Family History  Problem Relation Age of Onset  . Heart disease Father   . Hyperlipidemia Sister   . Hypertension Sister     Outpatient Encounter Prescriptions as of 04/01/2012  Medication Sig Dispense Refill  . alendronate (FOSAMAX) 70 MG tablet Take 1 tablet (70 mg total) by mouth every 7 (seven) days. Take with a full glass of water on an empty stomach.  30 tablet  11  . aspirin 81 MG tablet Take 81 mg by mouth daily.        . Calcium-Vitamin D (CALTRATE 600 PLUS-VIT D PO) Take by mouth.        . digoxin (LANOXIN) 0.25 MG tablet Take 250 mcg by mouth daily. 1/2 tablet one time daily       . diltiazem (TIAZAC) 180 MG 24 hr capsule Take 180 mg by mouth 2 (two) times daily.        . hydrochlorothiazide (MICROZIDE) 12.5 MG capsule Take 1 capsule (12.5 mg total) by mouth 2 (two) times daily.  180 capsule  2  . levothyroxine (SYNTHROID, LEVOTHROID) 50 MCG tablet Take 1 tablet (50 mcg total) by mouth daily.  90 tablet  1  . losartan (COZAAR)  100 MG tablet Take 1 tablet (100 mg total) by mouth daily.  90 tablet  3  . Multiple Vitamins-Minerals (CENTRUM SILVER PO) Take by mouth.        . naproxen (NAPROSYN) 500 MG tablet Take 1 tablet (500 mg total) by mouth 2 (two) times daily with a meal.  180 tablet  1  . omeprazole (PRILOSEC) 40 MG capsule TAKE ONE CAPSULE BY MOUTH EVERY DAY  30 capsule  2       Objective:   Physical Exam  Constitutional: She is oriented to person, place, and time. She appears well-developed and well-nourished.  HENT:  Head: Normocephalic and atraumatic.  Right Ear: External ear normal.  Left Ear: External ear normal.  Nose: Nose normal.  Mouth/Throat: Oropharynx is clear and moist.       TMs and canals are clear.   Eyes: Conjunctivae normal and EOM are normal. Pupils are equal, round, and reactive to light.  Neck: Neck supple. No thyromegaly present.  Cardiovascular: Normal rate, regular rhythm and normal heart sounds.        Nontender over the chest wall.    Pulmonary/Chest: Effort normal and breath sounds normal. She has no wheezes.  Abdominal: Soft. Bowel sounds are normal. She exhibits no distension and no mass. There is no tenderness. There is no rebound and no guarding.       Tenderness over the epigastrium as well as the right and left upper quadrants.  Lymphadenopathy:    She has no cervical adenopathy.  Neurological: She is alert and oriented to person, place, and time.  Skin: Skin is warm and dry.  Psychiatric: She has a normal mood and affect.          Assessment & Plan:  HTN - uncontrolled today in the office. Home blood pressures on average look at goal. I did not adjust her change her regimen today. Followup in 3 months.  Chest Pain - suspect reflux or GERD related. I did have her increase her Prilosec to twice a day for at least the next few weeks. Call she has a recurrence of symptoms. He EKG showed rate of 63 beats per minute, normal sinus rhythm, normal axis with poor R wave progression in the lateral leads. Unchanged from 2000 and time. Gave her reassurance.  Epigastric pain - will check CBC and liver enzymes but suspect it is gastritis and/or esophagitis related. Increase PPI to twice a day. Call if any problems or recurrence of symptoms.  Tongue ulcers-she's been prescribed a medication and she recently that works well. She does carry the name of it. I'll be happy to refill this for her she can have a referral request sent to our office.

## 2012-04-02 ENCOUNTER — Other Ambulatory Visit: Payer: Self-pay | Admitting: *Deleted

## 2012-04-02 ENCOUNTER — Encounter: Payer: Self-pay | Admitting: *Deleted

## 2012-04-02 ENCOUNTER — Telehealth: Payer: Self-pay | Admitting: *Deleted

## 2012-04-02 MED ORDER — OMEPRAZOLE 40 MG PO CPDR: 40.0000 mg | DELAYED_RELEASE_CAPSULE | Freq: Every day | ORAL | Status: DC

## 2012-04-02 MED ORDER — CLOTRIMAZOLE 10 MG MT LOZG: 10.0000 mg | LOZENGE | Freq: Every day | OROMUCOSAL | Status: DC

## 2012-04-02 NOTE — Telephone Encounter (Signed)
Left message for patient to return call. Blood pressure recheck.

## 2012-04-02 NOTE — Progress Notes (Signed)
Quick Note:  All labs are normal. ______ 

## 2012-04-02 NOTE — Telephone Encounter (Signed)
Pt wants a rx for clotramazole for her tongue. She says you guys discussed this at her office visit the other day

## 2012-04-02 NOTE — Telephone Encounter (Signed)
rx sent to [facility]

## 2012-04-03 ENCOUNTER — Encounter: Payer: Self-pay | Admitting: *Deleted

## 2012-04-03 NOTE — Telephone Encounter (Signed)
Pt.notified

## 2012-04-05 NOTE — Telephone Encounter (Signed)
Pt.notified

## 2012-05-01 ENCOUNTER — Encounter: Payer: Self-pay | Admitting: Family Medicine

## 2012-05-01 ENCOUNTER — Ambulatory Visit (INDEPENDENT_AMBULATORY_CARE_PROVIDER_SITE_OTHER): Payer: Medicare Other | Admitting: Family Medicine

## 2012-05-01 VITALS — BP 149/69 | HR 66 | Resp 16 | Wt 113.0 lb

## 2012-05-01 DIAGNOSIS — J019 Acute sinusitis, unspecified: Secondary | ICD-10-CM

## 2012-05-01 DIAGNOSIS — I10 Essential (primary) hypertension: Secondary | ICD-10-CM

## 2012-05-01 DIAGNOSIS — Z23 Encounter for immunization: Secondary | ICD-10-CM

## 2012-05-01 MED ORDER — AMBULATORY NON FORMULARY MEDICATION: Status: DC

## 2012-05-01 MED ORDER — ROSUVASTATIN CALCIUM 10 MG PO TABS: 10.0000 mg | ORAL_TABLET | Freq: Every day | ORAL | Status: DC

## 2012-05-01 MED ORDER — LOSARTAN POTASSIUM-HCTZ 100-25 MG PO TABS: 1.0000 | ORAL_TABLET | Freq: Every day | ORAL | Status: DC

## 2012-05-01 NOTE — Progress Notes (Signed)
  Subjective:    Patient ID: Jessica Dawson, female    DOB: [birth year], 67 y.o.   MRN: [id number]  HPI HTN- Denies chest pain or shortness of breath. She has had headaches, from Feb 4-16 th. She did take tylenol cold with some relief. Blood pressure readings 110/56-155/72. Most BPs around 130s.    Feels she is getting over a sinus infeciton . Was having pain and HA, but finally feeling  Better. No fever.     Review of Systems     Objective:   Physical Exam  Constitutional: She is oriented to person, place, and time. She appears well-developed and well-nourished.  HENT:  Head: Normocephalic and atraumatic.  Cardiovascular: Normal rate, regular rhythm and normal heart sounds.   Pulmonary/Chest: Effort normal and breath sounds normal.  Neurological: She is alert and oriented to person, place, and time.  Skin: Skin is warm and dry.  Psychiatric: She has a normal mood and affect. Her behavior is normal.          Assessment & Plan:  HTN - will combine hct and losartan.  F/Uin 1 month with BP log. Continue to watch the salt. I think this could be a big factor in her days were her pressures elevated. About 75% of her blood pressures looked well controlled on her log that she brought in today.  Sinusitis - resolving. Likely viral.    Discussed need for shingles vaccine. He is reaching given. Covered under her Medicare part D.

## 2012-05-29 ENCOUNTER — Encounter: Payer: Self-pay | Admitting: Family Medicine

## 2012-05-29 ENCOUNTER — Ambulatory Visit (INDEPENDENT_AMBULATORY_CARE_PROVIDER_SITE_OTHER): Payer: Medicare Other | Admitting: Family Medicine

## 2012-05-29 VITALS — BP 142/70 | HR 58 | Ht 59.6 in | Wt 112.0 lb

## 2012-05-29 DIAGNOSIS — I10 Essential (primary) hypertension: Secondary | ICD-10-CM

## 2012-05-29 MED ORDER — LOSARTAN POTASSIUM-HCTZ 100-25 MG PO TABS: 1.0000 | ORAL_TABLET | Freq: Every day | ORAL | Status: DC

## 2012-05-29 NOTE — Progress Notes (Signed)
  Subjective:    Patient ID: Jessica Dawson, female    DOB: [birth year], 67 y.o.   MRN: [id number]  HPI Cardiology added carvedilol for HR control on March 3 since could not longer afford digoxin.  Brought in BP log and moslty running around 110.  Had about 4 pressures under 100 bud didn't feel bad or lightheaded or dizziness.  Since starting the carvedilol pulse has dropped into the 50s. Lowest was 49.  Pt denies chest pain, SOB, dizziness, or heart palpitations.  Taking meds as directed w/o problems.  Denies medication side effects.      Review of Systems     Objective:   Physical Exam  Constitutional: She is oriented to person, place, and time. She appears well-developed and well-nourished.  HENT:  Head: Normocephalic and atraumatic.  Cardiovascular: Normal rate, regular rhythm and normal heart sounds.   Pulmonary/Chest: Effort normal and breath sounds normal.  Neurological: She is alert and oriented to person, place, and time.  Skin: Skin is warm and dry.  Psychiatric: She has a normal mood and affect. Her behavior is normal.          Assessment & Plan:  HTN - home blood pressures look fantastic for a few actually look a little low. Also pulses a little borderline low but she feels completely asymptomatic with this. We discussed option of decreasing her hydrochlorothiazide to compensate for the recent addition of the carvedilol versus just continuing to monitor her for the next couple months. She opted to keep everything away this and followup in 3 months. Encouraged her to call if she starts to feel lightheaded dizzy or sluggish especially when her blood pressures drop or her pulse is a little lower. Continue to work on low-salt diet. Right now she feels like her palpitations and arrhythmia are under fantastic control. She has a followup sometime in the next month with her cardiologist.

## 2012-09-02 ENCOUNTER — Encounter: Payer: Self-pay | Admitting: Family Medicine

## 2012-09-02 ENCOUNTER — Ambulatory Visit (INDEPENDENT_AMBULATORY_CARE_PROVIDER_SITE_OTHER): Payer: Medicare Other | Admitting: Family Medicine

## 2012-09-02 VITALS — BP 132/69 | HR 56 | Wt 114.0 lb

## 2012-09-02 DIAGNOSIS — I959 Hypotension, unspecified: Secondary | ICD-10-CM

## 2012-09-02 DIAGNOSIS — M545 Low back pain, unspecified: Secondary | ICD-10-CM

## 2012-09-02 DIAGNOSIS — I10 Essential (primary) hypertension: Secondary | ICD-10-CM

## 2012-09-02 DIAGNOSIS — M25559 Pain in unspecified hip: Secondary | ICD-10-CM

## 2012-09-02 DIAGNOSIS — K1370 Unspecified lesions of oral mucosa: Secondary | ICD-10-CM

## 2012-09-02 DIAGNOSIS — M2550 Pain in unspecified joint: Secondary | ICD-10-CM

## 2012-09-02 DIAGNOSIS — K121 Other forms of stomatitis: Secondary | ICD-10-CM

## 2012-09-02 LAB — CBC WITH DIFFERENTIAL/PLATELET
Basophils Absolute: 0.1 10*3/uL (ref 0.0–0.1)
Basophils Relative: 1 % (ref 0–1)
Eosinophils Absolute: 0.3 10*3/uL (ref 0.0–0.7)
Eosinophils Relative: 4 % (ref 0–5)
HCT: 38.3 % (ref 36.0–46.0)
MCHC: 33.2 g/dL (ref 30.0–36.0)
Monocytes Absolute: 0.6 10*3/uL (ref 0.1–1.0)
Neutro Abs: 4.3 10*3/uL (ref 1.7–7.7)
RDW: 13.9 % (ref 11.5–15.5)

## 2012-09-02 LAB — COMPLETE METABOLIC PANEL WITH GFR
ALT: 15 U/L (ref 0–35)
AST: 19 U/L (ref 0–37)
Albumin: 4.4 g/dL (ref 3.5–5.2)
Calcium: 10.3 mg/dL (ref 8.4–10.5)
Chloride: 102 mEq/L (ref 96–112)
Creat: 0.92 mg/dL (ref 0.50–1.10)
Potassium: 5.1 mEq/L (ref 3.5–5.3)

## 2012-09-02 LAB — LIPID PANEL
LDL Cholesterol: 220 mg/dL — ABNORMAL HIGH (ref 0–99)
Triglycerides: 224 mg/dL — ABNORMAL HIGH (ref <150)
VLDL: 45 mg/dL — ABNORMAL HIGH (ref 0–40)

## 2012-09-02 LAB — RHEUMATOID FACTOR: Rhuematoid fact SerPl-aCnc: 10 IU/mL (ref <=14)

## 2012-09-02 MED ORDER — LOSARTAN POTASSIUM 100 MG PO TABS: 100.0000 mg | ORAL_TABLET | Freq: Every day | ORAL | Status: DC

## 2012-09-02 MED ORDER — CLOTRIMAZOLE 10 MG MT LOZG: 10.0000 mg | LOZENGE | Freq: Every day | OROMUCOSAL | Status: DC

## 2012-09-02 NOTE — Progress Notes (Signed)
  Subjective:    Patient ID: Jessica Dawson, female    DOB: 05-26-1945, 67 y.o.   MRN: [id number]  HPI Says BP was low under 100 last night and this AM . Had a severe HA for several hours yesterday.  No CP ro SOB or lightheaded.    Has tongue blisters again. Started again a couple of weeks ago.  Wants refill on clotrimazole lozenges.  We have tried nystatin, antivirals, Magic mouthwash in the past. We've even tried prednisone. She only gets temporary relief with all of these.  She is also having a flare of her arthritis. She's been having some significant hip pain, low back pain, and right lower leg pain for couple of weeks.. She has been using her naproxen as needed and says her pain is a little better this week than it was last week. She denies any swelling or redness of the joints. Her hip pain is along the anterior hip crease as well as posteriorly. No significant family history for rheumatoid arthritis.   Review of Systems     Objective:   Physical Exam  Constitutional: She is oriented to person, place, and time. She appears well-developed and well-nourished.  HENT:  Head: Normocephalic and atraumatic.  She does have a cluster of blisters along the left side of the tongue and along the inner cheek mucosa on the left.  Cardiovascular: Normal rate, regular rhythm and normal heart sounds.   Pulmonary/Chest: Effort normal and breath sounds normal.  Neurological: She is alert and oriented to person, place, and time.  Skin: Skin is warm and dry.  Psychiatric: She has a normal mood and affect. Her behavior is normal.          Assessment & Plan:  Hypotension - Will stop the hctz and f/u in 3 months for BP. Call if stil having low episodes.    Tongue blisters- Wilsl refill her clotrimazole.  Sent locally.  Has seen derm for this already.   Polyarthralgia-most likely just osteoarthritis. Says that she's having a flare since she does seem to be slowly responding to anti-inflammatory. Also  consider evaluating for other inflammatory causes. We'll check a sedimentation rate, ANA, rheumatoid factor, anti-CCP, uric acid.

## 2012-09-03 ENCOUNTER — Other Ambulatory Visit: Payer: Self-pay | Admitting: Family Medicine

## 2012-09-03 DIAGNOSIS — M545 Low back pain, unspecified: Secondary | ICD-10-CM

## 2012-09-03 DIAGNOSIS — M25551 Pain in right hip: Secondary | ICD-10-CM

## 2012-09-03 LAB — CYCLIC CITRUL PEPTIDE ANTIBODY, IGG: Cyclic Citrullin Peptide Ab: <2 U/mL (ref 0.0–5.0)

## 2012-10-11 DEATH — deceased

## 2012-10-28 ENCOUNTER — Other Ambulatory Visit: Payer: Self-pay

## 2012-10-28 MED ORDER — NAPROXEN 500 MG PO TABS: 500.0000 mg | ORAL_TABLET | Freq: Two times a day (BID) | ORAL | Status: DC

## 2012-12-09 ENCOUNTER — Encounter: Payer: Self-pay | Admitting: Family Medicine

## 2012-12-09 ENCOUNTER — Ambulatory Visit (INDEPENDENT_AMBULATORY_CARE_PROVIDER_SITE_OTHER): Payer: Medicare Other | Admitting: Family Medicine

## 2012-12-09 VITALS — BP 145/64 | HR 56 | Wt 112.0 lb

## 2012-12-09 DIAGNOSIS — I498 Other specified cardiac arrhythmias: Secondary | ICD-10-CM

## 2012-12-09 DIAGNOSIS — I10 Essential (primary) hypertension: Secondary | ICD-10-CM

## 2012-12-09 DIAGNOSIS — M542 Cervicalgia: Secondary | ICD-10-CM

## 2012-12-09 DIAGNOSIS — R001 Bradycardia, unspecified: Secondary | ICD-10-CM

## 2012-12-09 DIAGNOSIS — E785 Hyperlipidemia, unspecified: Secondary | ICD-10-CM

## 2012-12-09 DIAGNOSIS — Z23 Encounter for immunization: Secondary | ICD-10-CM

## 2012-12-09 DIAGNOSIS — R252 Cramp and spasm: Secondary | ICD-10-CM

## 2012-12-09 LAB — BASIC METABOLIC PANEL WITH GFR
BUN: 26 mg/dL — ABNORMAL HIGH (ref 6–23)
CO2: 23 mEq/L (ref 19–32)
Calcium: 10.2 mg/dL (ref 8.4–10.5)
Chloride: 106 mEq/L (ref 96–112)
Creat: 0.84 mg/dL (ref 0.50–1.10)
Glucose, Bld: 94 mg/dL (ref 70–99)

## 2012-12-09 LAB — CK: Total CK: 81 U/L (ref 7–177)

## 2012-12-09 MED ORDER — CYCLOBENZAPRINE HCL 10 MG PO TABS: 5.0000 mg | ORAL_TABLET | Freq: Every evening | ORAL | Status: DC | PRN

## 2012-12-09 NOTE — Patient Instructions (Signed)
A few neck pain is not improving in the next 2-3 weeks and please let me know. Monitor for heart rate under 50 beats per minute. Let me know if you have frequently dropping below 50.

## 2012-12-09 NOTE — Progress Notes (Signed)
  Subjective:    Patient ID: Jessica Dawson, female    DOB: Apr 03, 1945, 67 y.o.   MRN: [id number]  HPI HTN-  Pt denies chest pain, SOB, dizziness, or heart palpitations.  Taking meds as directed w/o problems.  Denies medication side effects.  Brought in home blood pressure log. Home blood pressures look well controlled. Heart rate typically stays in the 50s.  Hyperlpidemia - Says only uses stating 2-3 times per week. Lab Results  Component Value Date   CHOL 317* 09/02/2012   HDL 52 09/02/2012   LDLCALC 220* 09/02/2012   LDLDIRECT 159* 11/27/2011   TRIG 224* 09/02/2012   CHOLHDL 6.1 09/02/2012    Hypothyroid -  Lab Results  Component Value Date   TSH 3.300 09/02/2012    Neck pain on left side x 3 weeks.  Knee have been hurting too. Will get cramps in posterior thigh. Sometimes bothers her at night. Hurts all the time. No trauma or injury. Has ben using rice pack and NSAID. Helps some.   Review of Systems     Objective:   Physical Exam  Constitutional: She is oriented to person, place, and time. She appears well-developed and well-nourished.  HENT:  Head: Normocephalic and atraumatic.  Cardiovascular: Normal rate, regular rhythm and normal heart sounds.   Pulmonary/Chest: Effort normal and breath sounds normal.  Neurological: She is alert and oriented to person, place, and time.  Skin: Skin is warm and dry.  Psychiatric: She has a normal mood and affect. Her behavior is normal.          Assessment & Plan:  HTN- stopped her HCTZ 3 months ago. Will repeat BMP today. Blood pressures look fantastic and are well controlled.   Bradycardia - Heart rate is running in the 50s and we did discuss this today. Continue to monitor since she is asymptomatic. If she notices her heart rate dropping into the 40s and we may need to consider adjusting her carvedilol or her diltiazem.  Hyperlipidemia - doing well overall her regimen to 3 times a week. She has a intolerance to statins it was trying  to get in a small dose weekly.  Muscle cramping - Will check electrolytes and muscle enzymes . Proabably not the statin.  Says started after starting the carvedilol.   Neck strain-will add muscle relaxer to her regimen. Given handout on stretches to do on her own at home. She's not improving in the next 2-3 weeks consider formal physical therapy. With no trauma I did not opt for x-ray today.

## 2012-12-10 NOTE — Progress Notes (Signed)
Quick Note:  All labs are normal. ______ 

## 2013-01-14 ENCOUNTER — Other Ambulatory Visit: Payer: Self-pay | Admitting: *Deleted

## 2013-01-14 MED ORDER — OMEPRAZOLE 40 MG PO CPDR: 40.0000 mg | DELAYED_RELEASE_CAPSULE | Freq: Every day | ORAL | Status: DC

## 2013-02-11 ENCOUNTER — Other Ambulatory Visit: Payer: Self-pay | Admitting: *Deleted

## 2013-02-11 MED ORDER — LEVOTHYROXINE SODIUM 50 MCG PO TABS: 50.0000 ug | ORAL_TABLET | Freq: Every day | ORAL | Status: DC

## 2013-02-14 ENCOUNTER — Other Ambulatory Visit: Payer: Self-pay | Admitting: *Deleted

## 2013-02-14 MED ORDER — LEVOTHYROXINE SODIUM 50 MCG PO TABS: 50.0000 ug | ORAL_TABLET | Freq: Every day | ORAL | Status: DC

## 2013-03-07 ENCOUNTER — Other Ambulatory Visit: Payer: Self-pay | Admitting: *Deleted

## 2013-03-07 MED ORDER — LOSARTAN POTASSIUM 100 MG PO TABS: 100.0000 mg | ORAL_TABLET | Freq: Every day | ORAL | Status: DC

## 2013-04-28 ENCOUNTER — Other Ambulatory Visit: Payer: Self-pay | Admitting: *Deleted

## 2013-04-28 MED ORDER — NAPROXEN 500 MG PO TABS: 500.0000 mg | ORAL_TABLET | Freq: Two times a day (BID) | ORAL | Status: DC

## 2013-06-09 ENCOUNTER — Encounter: Payer: Self-pay | Admitting: Family Medicine

## 2013-06-09 ENCOUNTER — Ambulatory Visit (INDEPENDENT_AMBULATORY_CARE_PROVIDER_SITE_OTHER): Payer: Medicare Other | Admitting: Family Medicine

## 2013-06-09 VITALS — BP 128/61 | HR 55 | Wt 117.0 lb

## 2013-06-09 DIAGNOSIS — E785 Hyperlipidemia, unspecified: Secondary | ICD-10-CM

## 2013-06-09 DIAGNOSIS — E039 Hypothyroidism, unspecified: Secondary | ICD-10-CM

## 2013-06-09 DIAGNOSIS — S39012A Strain of muscle, fascia and tendon of lower back, initial encounter: Secondary | ICD-10-CM

## 2013-06-09 DIAGNOSIS — I10 Essential (primary) hypertension: Secondary | ICD-10-CM

## 2013-06-09 DIAGNOSIS — S335XXA Sprain of ligaments of lumbar spine, initial encounter: Secondary | ICD-10-CM

## 2013-06-09 LAB — COMPLETE METABOLIC PANEL WITH GFR
ALK PHOS: 79 U/L (ref 39–117)
ALT: 14 U/L (ref 0–35)
AST: 20 U/L (ref 0–37)
Albumin: 4.6 g/dL (ref 3.5–5.2)
BUN: 29 mg/dL — AB (ref 6–23)
CALCIUM: 9.8 mg/dL (ref 8.4–10.5)
CHLORIDE: 104 meq/L (ref 96–112)
CO2: 25 mEq/L (ref 19–32)
Creat: 0.77 mg/dL (ref 0.50–1.10)
GFR, Est African American: >89 mL/min
GFR, Est Non African American: 78 mL/min
Glucose, Bld: 87 mg/dL (ref 70–99)
POTASSIUM: 4.2 meq/L (ref 3.5–5.3)
Sodium: 139 mEq/L (ref 135–145)
Total Bilirubin: 0.3 mg/dL (ref 0.2–1.2)
Total Protein: 7.5 g/dL (ref 6.0–8.3)

## 2013-06-09 LAB — LIPID PANEL
CHOL/HDL RATIO: 6 ratio
CHOLESTEROL: 305 mg/dL — AB (ref 0–200)
HDL: 51 mg/dL (ref >39)
LDL CALC: 220 mg/dL — AB (ref 0–99)
Triglycerides: 172 mg/dL — ABNORMAL HIGH (ref <150)
VLDL: 34 mg/dL (ref 0–40)

## 2013-06-09 MED ORDER — CYCLOBENZAPRINE HCL 10 MG PO TABS: 5.0000 mg | ORAL_TABLET | Freq: Every evening | ORAL | Status: DC | PRN

## 2013-06-09 NOTE — Progress Notes (Signed)
Called pt's mail order spoke w/tiffany to cancel order for flexeril 10 mg [phone])

## 2013-06-09 NOTE — Progress Notes (Signed)
   Subjective:    Patient ID: Jessica Dawson, female    DOB: 03-29-1945, 68 y.o.   MRN: [id number]  HPI Hypertension- Pt denies chest pain, SOB, dizziness, or heart palpitations.  Taking meds as directed w/o problems.  Denies medication side effects.  Currently on losartan, carvedilol, baby aspirin daily.  Like to discuss colon cancer screening.  Hyperlipidemia-last lipid check 6 months ago cholesterol is significantly elevated. We'll call her and reminded her to make sure that she was taking her cholesterol medication every night.  Has been having some lower back pain and says the statin makes it worse so stopped it months ago.    Low back pain x 1 month. Radiating back into both of legs in posterior thigh. Worse in the evening. No trauma or injury.  No alleviating factors, excpet naproxen seems to help.  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    Review of Systems     Objective:   Physical Exam  Constitutional: She is oriented to person, place, and time. She appears well-developed and well-nourished.  HENT:  Head: Normocephalic and atraumatic.  Cardiovascular: Normal rate, regular rhythm and normal heart sounds.   Pulmonary/Chest: Effort normal and breath sounds normal.  Musculoskeletal:  Low back with normal range of motion. She's very tender over the sacrum. Negative straight leg raise bilaterally. Hip, knee, ankle strength is 5 out of 5 bilaterally. Patellar reflexes 2+ bilaterally.  Neurological: She is alert and oriented to person, place, and time.  Skin: Skin is warm and dry.  Psychiatric: She has a normal mood and affect. Her behavior is normal.          Assessment & Plan:  Hypertension- Well controlled on current regimen. F/U in 6 months.   Hyperlipidemia-due to recheck lipids to make sure that they're adequately controlled on her Crestor 10 mg.  Hypothyroidism-due to recheck TSH today.  Low back pain - Given H.O on exercise at home.  Continue the naproxen.  If not better in 2-3 weeks then pt to call and will order lumbar xray of spine.  RF her muscle relaxer.    Had shingles vaccine done last year at [facility]. Chart update.    Stool cards given for colon cancer screening.

## 2013-06-10 LAB — TSH: TSH: 3.999 u[IU]/mL (ref 0.350–4.500)

## 2013-08-07 ENCOUNTER — Telehealth: Payer: Self-pay | Admitting: *Deleted

## 2013-08-07 NOTE — Telephone Encounter (Signed)
Erskine Squibb from [facility] requesting pt's most recent lab results to be faxed to [phone].Deno Etienne

## 2013-09-08 ENCOUNTER — Other Ambulatory Visit: Payer: Self-pay | Admitting: *Deleted

## 2013-09-08 MED ORDER — LOSARTAN POTASSIUM 100 MG PO TABS: 100.0000 mg | ORAL_TABLET | Freq: Every day | ORAL | Status: DC

## 2013-11-12 ENCOUNTER — Telehealth: Payer: Self-pay | Admitting: *Deleted

## 2013-11-12 ENCOUNTER — Ambulatory Visit (INDEPENDENT_AMBULATORY_CARE_PROVIDER_SITE_OTHER): Payer: Medicare Other

## 2013-11-12 DIAGNOSIS — M439 Deforming dorsopathy, unspecified: Secondary | ICD-10-CM

## 2013-11-12 DIAGNOSIS — M5137 Other intervertebral disc degeneration, lumbosacral region: Secondary | ICD-10-CM

## 2013-11-12 DIAGNOSIS — M545 Low back pain, unspecified: Secondary | ICD-10-CM

## 2013-11-12 DIAGNOSIS — Q762 Congenital spondylolisthesis: Secondary | ICD-10-CM

## 2013-11-12 NOTE — Telephone Encounter (Signed)
Pt informed.Jessica Dawson  

## 2013-11-12 NOTE — Telephone Encounter (Signed)
No problem, order placed. She can go anytime during regular hours at her convenience.

## 2013-11-12 NOTE — Telephone Encounter (Signed)
Pt called and stated that her low back pain is not any better and would like to get xrays done. Please advise.Jessica Dawson [place]

## 2013-11-24 ENCOUNTER — Ambulatory Visit (INDEPENDENT_AMBULATORY_CARE_PROVIDER_SITE_OTHER): Payer: Medicare Other | Admitting: Sports Medicine

## 2013-11-24 ENCOUNTER — Encounter: Payer: Self-pay | Admitting: Sports Medicine

## 2013-11-24 VITALS — BP 159/74 | HR 54 | Ht 64.0 in | Wt 112.0 lb

## 2013-11-24 DIAGNOSIS — IMO0002 Reserved for concepts with insufficient information to code with codable children: Secondary | ICD-10-CM

## 2013-11-24 DIAGNOSIS — M5416 Radiculopathy, lumbar region: Secondary | ICD-10-CM

## 2013-11-24 MED ORDER — PREDNISONE 50 MG PO TABS: ORAL_TABLET | ORAL | Status: DC

## 2013-11-24 MED ORDER — NAPROXEN-ESOMEPRAZOLE 500-20 MG PO TBEC: 1.0000 | DELAYED_RELEASE_TABLET | Freq: Two times a day (BID) | ORAL | Status: DC

## 2013-11-24 NOTE — Progress Notes (Signed)
Patient ID: Jessica Dawson, female   DOB: 03/05/1946, 68 y.o.   MRN: [id number]   Subjective:    I'm seeing this patient as a consultation for: [name], MD  CC: Back and leg pain  HPI: Jessica Dawson is a very pleasant 68 year old female with history of osteoporosis who presents with several years of waxing and waning back and leg pain that has progressively worsened over the past 6 months. She reports lumbar back pain that radiates bilaterally through the posterior leg above the knee to the lateral calf and lateral foot. States that the sensation is like "pins and needles" in her legs. Pain is exacerbated with extension/rotation and improved with use of Naproxen and Tylenol daily, though the Naproxen does cause some stomach discomfort. She walks one mile daily and frequently works in her garden, and feels that activity helps the pain. Pain is also improved with flexion. No saddle paresthesias, loss of bowel or bladder control. Imaging of the lumbar spine was done in our office two weeks ago (9/2), which did reveal degenerative disc disease at L2-L3, anterolisthesis of L4 on L5, and mild curvature of the lumbar spine.  Past medical history, Surgical history, Family history not pertinant except as noted below, Social history, Allergies, and medications have been entered into the medical record, reviewed, and no changes needed.   Review of Systems: No headache, visual changes, nausea, vomiting, diarrhea, constipation, dizziness, skin rash, fevers, chills, night sweats, weight loss, swollen lymph nodes, chest pain, shortness of breath, mood changes, visual or auditory hallucinations.   Objective:   General: Well developed, well nourished, and in no acute distress.  Neuro/Psych: Alert and oriented x3, extra-ocular muscles intact, able to move all 4 extremities, sensation grossly intact. Skin: Warm and dry, no rashes noted.  Respiratory: Not using accessory muscles, speaking in full sentences,  trachea midline.  Cardiovascular: Pulses palpable, no extremity edema. Abdomen: Does not appear distended.  Back Exam:  Inspection: Unremarkable  Motion: Flexion 45 deg, Extension 45 deg, Side Bending to 45 deg bilaterally,  Rotation to 45 deg bilaterally  SLR laying: Negative  XSLR laying: Negative  Palpable tenderness: None. FABER: negative. Sensory change: Gross sensation intact to all lumbar and sacral dermatomes.  Reflexes: 2+ at right patellar, 1+ at left patellar tendon, 2+ at achilles tendons, Babinski's downgoing.  Strength at foot  Plantar-flexion: 5/5 Dorsi-flexion: 5/5 Eversion: 5/5 Inversion: 5/5  Leg strength  Quad: 5/5 Hamstring: 5/5 Hip flexor: 5/5 Hip abductors: 5/5  Gait unremarkable.  Impression and Recommendations:   This case required medical decision making of moderate complexity. Back pain with radicular symptoms: Lumbar spinal stenosis with radicular symptoms is the most likely diagnosis in this patient with lower back pain, radicular symptoms, and decreased patellar tendon reflex on the left. This is further supported by her history of pain relief when sitting forward and the presence of pain with back extension on exam. - Prednisone - Continue Naproxen, samples of Vimovo given - Home rehabilitation - Return in 4 weeks with plan to MRI if no improvement

## 2013-11-24 NOTE — Assessment & Plan Note (Signed)
Prednisone, continue naproxen, samples of provided. Home rehabilitation, return in 4 weeks, MRI for intervention if no better. This clinically represents lumbar spinal stenosis with L4 radicular symptoms.

## 2013-12-10 ENCOUNTER — Ambulatory Visit (INDEPENDENT_AMBULATORY_CARE_PROVIDER_SITE_OTHER): Payer: Medicare Other | Admitting: Family Medicine

## 2013-12-10 VITALS — BP 147/72 | HR 64 | Ht 59.0 in | Wt 115.0 lb

## 2013-12-10 DIAGNOSIS — Z23 Encounter for immunization: Secondary | ICD-10-CM

## 2013-12-10 NOTE — Progress Notes (Signed)
Patient was in office for Flu injection. Patient denied any fevers or chills. Rhonda Cunningham,CMA

## 2013-12-22 ENCOUNTER — Other Ambulatory Visit: Payer: Self-pay | Admitting: *Deleted

## 2013-12-22 ENCOUNTER — Ambulatory Visit: Payer: Medicare Other | Admitting: Sports Medicine

## 2013-12-22 MED ORDER — LOSARTAN POTASSIUM 100 MG PO TABS: 100.0000 mg | ORAL_TABLET | Freq: Every day | ORAL | Status: DC

## 2014-01-05 ENCOUNTER — Encounter: Payer: Self-pay | Admitting: Sports Medicine

## 2014-01-05 ENCOUNTER — Ambulatory Visit (INDEPENDENT_AMBULATORY_CARE_PROVIDER_SITE_OTHER): Payer: Medicare Other | Admitting: Sports Medicine

## 2014-01-05 VITALS — BP 154/72 | HR 62 | Ht 59.0 in | Wt 114.0 lb

## 2014-01-05 DIAGNOSIS — M5416 Radiculopathy, lumbar region: Secondary | ICD-10-CM

## 2014-01-05 MED ORDER — GABAPENTIN 300 MG PO CAPS: 300.0000 mg | ORAL_CAPSULE | Freq: Every day | ORAL | Status: DC

## 2014-01-05 NOTE — Progress Notes (Signed)
  Subjective:    CC: Follow-up  HPI: Left lumbar radiculitis: Left L4 distribution but occasionally bilateral symptoms, doing much better with home rehabilitation. Approximately 60-70% better. Symptoms are mild, improving.  Past medical history, Surgical history, Family history not pertinant except as noted below, Social history, Allergies, and medications have been entered into the medical record, reviewed, and no changes needed.   Review of Systems: No fevers, chills, night sweats, weight loss, chest pain, or shortness of breath.   Objective:    General: Well Developed, well nourished, and in no acute distress.  Neuro: Alert and oriented x3, extra-ocular muscles intact, sensation grossly intact.  HEENT: Normocephalic, atraumatic, pupils equal round reactive to light, neck supple, no masses, no lymphadenopathy, thyroid nonpalpable.  Skin: Warm and dry, no rashes. Cardiac: Regular rate and rhythm, no murmurs rubs or gallops, no lower extremity edema.  Respiratory: Clear to auscultation bilaterally. Not using accessory muscles, speaking in full sentences.  Impression and Recommendations:

## 2014-01-05 NOTE — Assessment & Plan Note (Addendum)
Left L4. 10 mm of L4 on L5 spondylolisthesis. 60% better with conservative measures including home rehabilitation exercises and naproxen. Formal PT was too expensive. At this point we are going to add 300 mg of gabapentin at bedtime. Continue rehabilitation, when she returns if her symptom relief has plateaued we can proceed with an MRI for interventional injection planning.

## 2014-02-02 ENCOUNTER — Ambulatory Visit: Payer: Medicare Other | Admitting: Sports Medicine

## 2014-02-13 ENCOUNTER — Other Ambulatory Visit: Payer: Self-pay | Admitting: Family Medicine

## 2014-02-25 ENCOUNTER — Encounter: Payer: Self-pay | Admitting: Family Medicine

## 2014-02-25 ENCOUNTER — Ambulatory Visit (INDEPENDENT_AMBULATORY_CARE_PROVIDER_SITE_OTHER): Payer: Medicare Other | Admitting: Family Medicine

## 2014-02-25 VITALS — BP 139/73 | HR 56 | Wt 115.0 lb

## 2014-02-25 DIAGNOSIS — E039 Hypothyroidism, unspecified: Secondary | ICD-10-CM

## 2014-02-25 DIAGNOSIS — S39012D Strain of muscle, fascia and tendon of lower back, subsequent encounter: Secondary | ICD-10-CM

## 2014-02-25 DIAGNOSIS — R42 Dizziness and giddiness: Secondary | ICD-10-CM

## 2014-02-25 DIAGNOSIS — I10 Essential (primary) hypertension: Secondary | ICD-10-CM

## 2014-02-25 DIAGNOSIS — S39012A Strain of muscle, fascia and tendon of lower back, initial encounter: Secondary | ICD-10-CM | POA: Insufficient documentation

## 2014-02-25 LAB — BASIC METABOLIC PANEL WITH GFR
BUN: 26 mg/dL — AB (ref 6–23)
CALCIUM: 9.9 mg/dL (ref 8.4–10.5)
CHLORIDE: 104 meq/L (ref 96–112)
CO2: 25 mEq/L (ref 19–32)
CREATININE: 0.79 mg/dL (ref 0.50–1.10)
GFR, EST NON AFRICAN AMERICAN: 76 mL/min
GFR, Est African American: 88 mL/min
Glucose, Bld: 89 mg/dL (ref 70–99)
Potassium: 4.9 mEq/L (ref 3.5–5.3)
Sodium: 139 mEq/L (ref 135–145)

## 2014-02-25 MED ORDER — CLOTRIMAZOLE 10 MG MT TROC: 10.0000 mg | Freq: Every day | OROMUCOSAL | Status: DC

## 2014-02-25 MED ORDER — LOSARTAN POTASSIUM 100 MG PO TABS: 100.0000 mg | ORAL_TABLET | Freq: Every day | ORAL | Status: DC

## 2014-02-25 MED ORDER — CYCLOBENZAPRINE HCL 10 MG PO TABS: 5.0000 mg | ORAL_TABLET | Freq: Every evening | ORAL | Status: DC | PRN

## 2014-02-25 MED ORDER — LEVOTHYROXINE SODIUM 50 MCG PO TABS: 50.0000 ug | ORAL_TABLET | Freq: Every day | ORAL | Status: DC

## 2014-02-25 NOTE — Progress Notes (Signed)
   Subjective:    Patient ID: Jessica Dawson, female    DOB: [birth year], 68 y.o.   MRN: [id number]  HPI Hypertension- Pt denies chest pain, SOB, dizziness, or heart palpitations.  Taking meds as directed w/o problems.  Denies medication side effects.  She did bring in a log of her home blood pressures. Blood pressures are primarily in the 110-140 range. Diastolics primarily running in the 50s and 60s. Pulse typically running in the 50s and low 60s.she's currently on carvedilol, losartan.  Hypothyroidism-currently on levothyroxine 50 g daily. No skin or hair changes.  No weight change or energy level.    Went for eye exam recently and told may have some early macular degeneration.  They recommend that she start a oral eye vitamin. It contains vitamin E and gluten and omega-3. She just wants to make sure that it's okay to take this with her other medications.  She's also been getting a little bit of dizziness at night when she wakes up to get to go the bathroom. She  feel a little dizzy like the room is spinning. She says it's very brief and doesn't seem to bother her during the day. Has happened about 3 nights. She denies any ear pain pressure recent cold symptoms. No chest pain or short of breath when this happens.  Review of Systems     Objective:   Physical Exam  Constitutional: She is oriented to person, place, and time. She appears well-developed and well-nourished.  HENT:  Head: Normocephalic and atraumatic.  Right Ear: External ear normal.  Left Ear: External ear normal.  Nose: Nose normal.  Mouth/Throat: Oropharynx is clear and moist.  TMs and canals are clear.   Eyes: Conjunctivae and EOM are normal. Pupils are equal, round, and reactive to light.  Neck: Neck supple. No thyromegaly present.  Cardiovascular: Normal rate, regular rhythm and normal heart sounds.   Pulmonary/Chest: Effort normal and breath sounds normal. She has no wheezes.  Lymphadenopathy:    She has no cervical  adenopathy.  Neurological: She is alert and oriented to person, place, and time. No cranial nerve deficit. Coordination normal.  Negative Dix-Hallpike maneuver.  Skin: Skin is warm and dry.  Psychiatric: She has a normal mood and affect. Her behavior is normal.          Assessment & Plan:  Hypertension-well-controlled. Continue current regimen.due for BMP today.  Hypothyroidism-we'll recheck TSH. She feels like symptoms are controlled overall.  Vertigo -  Encouraged her to keep an eye on it. If becomes more frequent or tapping during the day or becomes more intense or last longer than please let me know. Your exam was normal and had a negative Dix-Hallpike maneuver today.  Due for updated mammogram-reminded her last mammogram was 2 years ago in September. She really wants to hold off. I encouraged her to really think about it.

## 2014-02-26 LAB — TSH: TSH: 2.793 u[IU]/mL (ref 0.350–4.500)

## 2014-02-26 NOTE — Progress Notes (Signed)
Quick Note:  All labs are normal. ______ 

## 2014-07-10 ENCOUNTER — Other Ambulatory Visit: Payer: Self-pay | Admitting: Family Medicine

## 2014-07-10 MED ORDER — LEVOTHYROXINE SODIUM 50 MCG PO TABS: 50.0000 ug | ORAL_TABLET | Freq: Every day | ORAL | Status: DC

## 2014-08-24 ENCOUNTER — Encounter: Payer: Self-pay | Admitting: Family Medicine

## 2014-08-24 ENCOUNTER — Ambulatory Visit (INDEPENDENT_AMBULATORY_CARE_PROVIDER_SITE_OTHER): Payer: Medicare Other | Admitting: Family Medicine

## 2014-08-24 VITALS — BP 138/82 | HR 61 | Ht 59.0 in | Wt 112.0 lb

## 2014-08-24 DIAGNOSIS — I10 Essential (primary) hypertension: Secondary | ICD-10-CM

## 2014-08-24 DIAGNOSIS — I471 Supraventricular tachycardia: Secondary | ICD-10-CM

## 2014-08-24 DIAGNOSIS — E0590 Thyrotoxicosis, unspecified without thyrotoxic crisis or storm: Secondary | ICD-10-CM | POA: Diagnosis not present

## 2014-08-24 DIAGNOSIS — M6530 Trigger finger, unspecified finger: Secondary | ICD-10-CM

## 2014-08-24 DIAGNOSIS — M5416 Radiculopathy, lumbar region: Secondary | ICD-10-CM | POA: Diagnosis not present

## 2014-08-24 DIAGNOSIS — Z23 Encounter for immunization: Secondary | ICD-10-CM | POA: Diagnosis not present

## 2014-08-24 DIAGNOSIS — M2041 Other hammer toe(s) (acquired), right foot: Secondary | ICD-10-CM

## 2014-08-24 DIAGNOSIS — E785 Hyperlipidemia, unspecified: Secondary | ICD-10-CM | POA: Diagnosis not present

## 2014-08-24 LAB — LIPID PANEL
CHOLESTEROL: 347 mg/dL — AB (ref 0–200)
HDL: 52 mg/dL (ref >=46)
LDL Cholesterol: 253 mg/dL — ABNORMAL HIGH (ref 0–99)
TRIGLYCERIDES: 212 mg/dL — AB (ref <150)
Total CHOL/HDL Ratio: 6.7 Ratio
VLDL: 42 mg/dL — AB (ref 0–40)

## 2014-08-24 LAB — COMPLETE METABOLIC PANEL WITH GFR
ALT: 13 U/L (ref 0–35)
AST: 20 U/L (ref 0–37)
Albumin: 4.5 g/dL (ref 3.5–5.2)
Alkaline Phosphatase: 78 U/L (ref 39–117)
BILIRUBIN TOTAL: 0.5 mg/dL (ref 0.2–1.2)
BUN: 18 mg/dL (ref 6–23)
CO2: 23 mEq/L (ref 19–32)
Calcium: 9.7 mg/dL (ref 8.4–10.5)
Chloride: 103 mEq/L (ref 96–112)
Creat: 0.79 mg/dL (ref 0.50–1.10)
GFR, Est African American: 87 mL/min
GFR, Est Non African American: 76 mL/min
GLUCOSE: 89 mg/dL (ref 70–99)
POTASSIUM: 4.3 meq/L (ref 3.5–5.3)
Sodium: 138 mEq/L (ref 135–145)
Total Protein: 7.6 g/dL (ref 6.0–8.3)

## 2014-08-24 LAB — TSH: TSH: 5.352 u[IU]/mL — ABNORMAL HIGH (ref 0.350–4.500)

## 2014-08-24 MED ORDER — LOSARTAN POTASSIUM 100 MG PO TABS: 100.0000 mg | ORAL_TABLET | Freq: Every day | ORAL | Status: DC

## 2014-08-24 MED ORDER — GABAPENTIN 300 MG PO CAPS: 300.0000 mg | ORAL_CAPSULE | Freq: Every day | ORAL | Status: DC

## 2014-08-24 MED ORDER — NAPROXEN 500 MG PO TABS: ORAL_TABLET | ORAL | Status: DC

## 2014-08-24 MED ORDER — LEVOTHYROXINE SODIUM 50 MCG PO TABS: 50.0000 ug | ORAL_TABLET | Freq: Every day | ORAL | Status: DC

## 2014-08-24 NOTE — Patient Instructions (Signed)
Cut the losartan in half and monitor Blood pressure daily for about 3 weeks.  Trigger Finger Trigger finger (digital tendinitis and stenosing tenosynovitis) is a common disorder that causes an often painful catching of the fingers or thumb. It occurs as a clicking, snapping, or locking of a finger in the palm of the hand. This is caused by a problem with the tendons that flex or bend the fingers sliding smoothly through their sheaths. The condition may occur in any finger or a couple fingers at the same time.  The finger may lock with the finger curled or suddenly straighten out with a snap. This is more common in patients with rheumatoid arthritis and diabetes. Left untreated, the condition may get worse to the point where the finger becomes locked in flexion, like making a fist, or less commonly locked with the finger straightened out. CAUSES   Inflammation and scarring that lead to swelling around the tendon sheath.  Repeated or forceful movements.  Rheumatoid arthritis, an autoimmune disease that affects joints.  Gout.  Diabetes mellitus. SIGNS AND SYMPTOMS  Soreness and swelling of your finger.  A painful clicking or snapping as you bend and straighten your finger. DIAGNOSIS  Your health care provider will do a physical exam of your finger to diagnose trigger finger. TREATMENT   Splinting for 6-8 weeks may be helpful.  Nonsteroidal anti-inflammatory medicines (NSAIDs) can help to relieve the pain and inflammation.  Cortisone injections, along with splinting, may speed up recovery. Several injections may be required. Cortisone may give relief after one injection.  Surgery is another treatment that may be used if conservative treatments do not work. Surgery can be minor, without incisions (a cut does not have to be made), and can be done with a needle through the skin.  Other surgical choices involve an open procedure in which the surgeon opens the hand through a small incision and  cuts the pulley so the tendon can again slide smoothly. Your hand will still work fine. HOME CARE INSTRUCTIONS  Apply ice to the injured area, twice per day:  Put ice in a plastic bag.  Place a towel between your skin and the bag.  Leave the ice on for 20 minutes, 3-4 times a day.  Rest your hand often. MAKE SURE YOU:   Understand these instructions.  Will watch your condition.  Will get help right away if you are not doing well or get worse. Document Released: 12/18/2003 Document Revised: 10/30/2012 Document Reviewed: 07/30/2012 [facility] Patient Information 2015 [place], [place]. This information is not intended to replace advice given to you by your health care provider. Make sure you discuss any questions you have with your health care provider.

## 2014-08-24 NOTE — Progress Notes (Signed)
   Subjective:    Patient ID: Jessica Dawson, female    DOB: 1945-09-23, 69 y.o.   MRN: [id number]  HPI Hypertension- Pt denies chest pain, SOB, dizziness, or heart palpitations.  Taking meds as directed w/o problems.  Denies medication side effects. Brought in log of blood pressure.    History of paroxysmal tachycardia.  Saw her cardiologist in May.  Feels like episodes have been lasting longer.  Last episdoelasted 5 hours and almost went to the ED.    Trigger fingers in her right hanh, middle finger.   Hammer toe on 2nd toe on the right foot. She has been gently taping it down. Is not very painful but it's starting to raise up more and starting to cross towards her big toe that her big toe is turning outward. She has a large bunion on that foot.  Review of Systems     Objective:   Physical Exam  Constitutional: She is oriented to person, place, and time. She appears well-developed and well-nourished.  HENT:  Head: Normocephalic and atraumatic.  Cardiovascular: Normal rate, regular rhythm and normal heart sounds.   Pulmonary/Chest: Effort normal and breath sounds normal.  Musculoskeletal:  Left hand, middle finger triggers very easily on most every time she completely close the surface. She says sometimes it actually gets tender and sore over the palm. On the second toe on the right foot she also has a hammertoe. The great toe is actually starting to turn outward and she has a very large bunion on that foot.  Neurological: She is alert and oriented to person, place, and time.  Skin: Skin is warm and dry.  Psychiatric: She has a normal mood and affect. Her behavior is normal.          Assessment & Plan:  HTN - blood pressures are home are actually mostly low. Especially diastolics. Running anywhere from the 40s to 60s. Her blood pressures tend to run high when she comes in to the doctor's office. Cut losartan in half and record Bps x 3 weeks. Her diastolic pressure have been really  low.    PSVT - episodes are lasting longer. Considering ablation.  Encouraged her to keep f/u with cards and call sooner if she would like. She has declined the procedure so far.  Continue carvedilol.    Hammer toe - we discussed options. Ultimately she could have surgical treatment if she desired. It is not very painful or bothersome she certainly okay to use any tape or pad for cushioning. Also encouraged to make sure that the toe box on her shoes is wide enough that it's not compressing the toes together.  Trigger finger - discussed treatment with an injection. Recommend referral to our sports medicine doctor for further evaluation and treatment.  Lumbar radiculitis-she would like a refill on the gabapentin that Dr. Benjamin Stain have given her. She would like it sent to mail order.  Hyperlipidemia-due to recheck lipids. Tolerated statin well without side effects or problems.  Prevnar 13 given today.

## 2014-08-25 MED ORDER — LEVOTHYROXINE SODIUM 75 MCG PO TABS: 75.0000 ug | ORAL_TABLET | Freq: Every day | ORAL | Status: DC

## 2014-08-25 NOTE — Addendum Note (Signed)
Addended by: [name] D on: 08/25/2014 05:29 PM   Modules accepted: Orders

## 2014-10-03 IMAGING — CR DG LUMBAR SPINE COMPLETE 4+V
5 series · 5 of 5 positions shown · non-contrast
Comparison: None.

CLINICAL DATA: Low back pain for 3 months, some radiation to the
left leg

EXAM:
LUMBAR SPINE - COMPLETE 4+ VIEW

[view not recorded (1 of 5)]
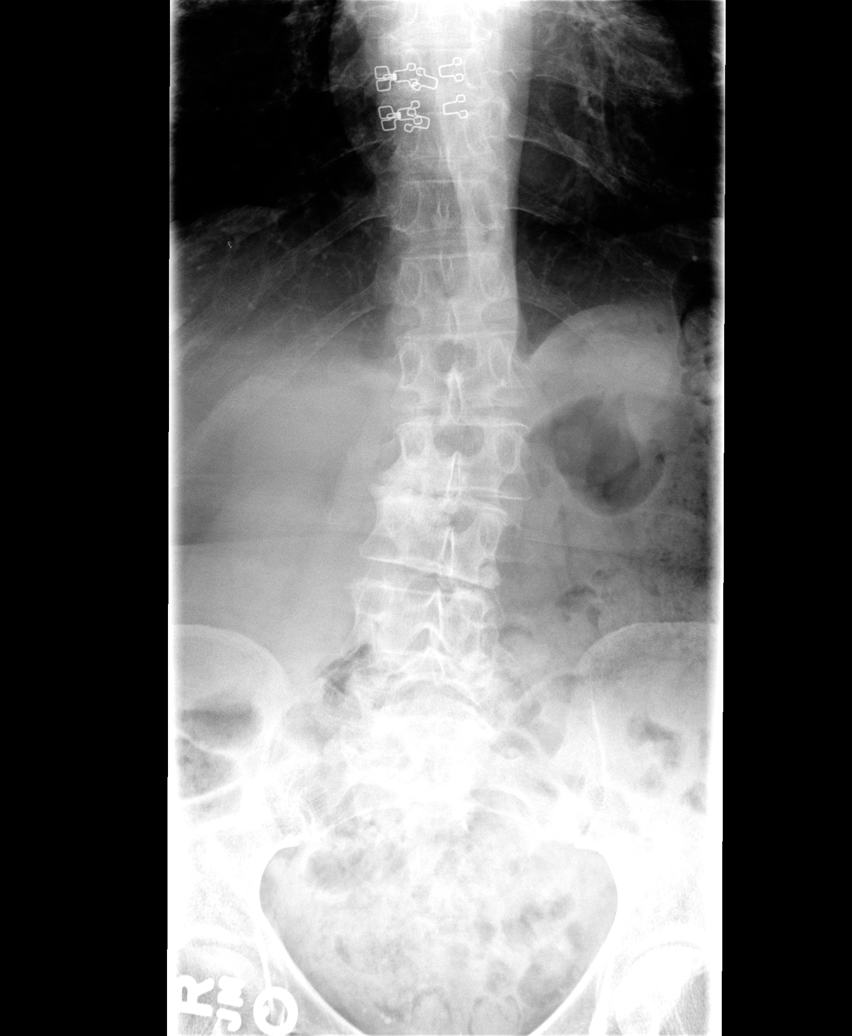

[view not recorded (2 of 5)]
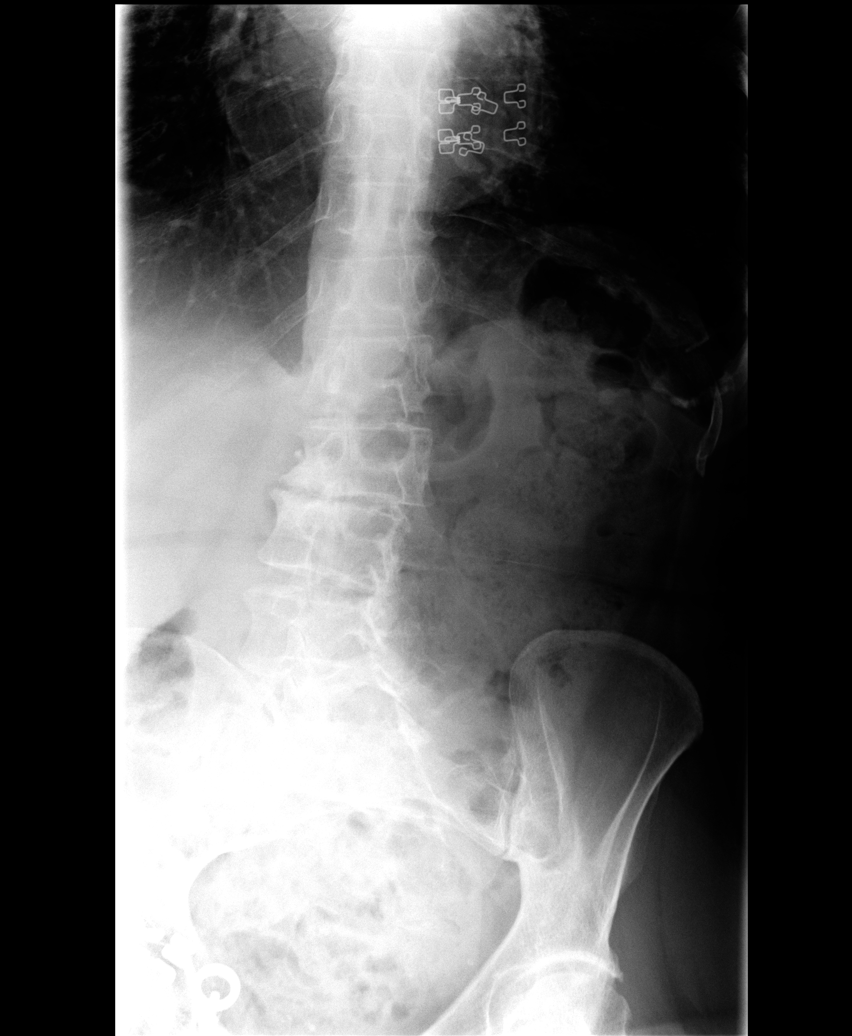

[view not recorded (3 of 5)]
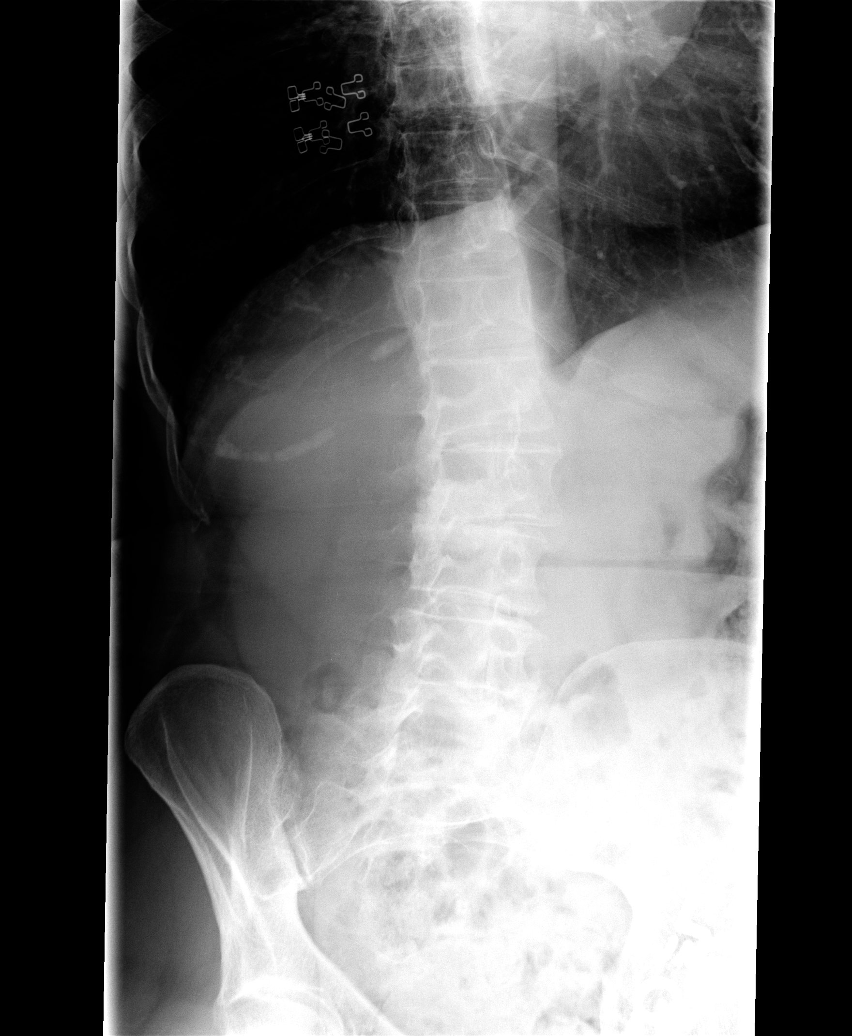

[view not recorded (4 of 5)]
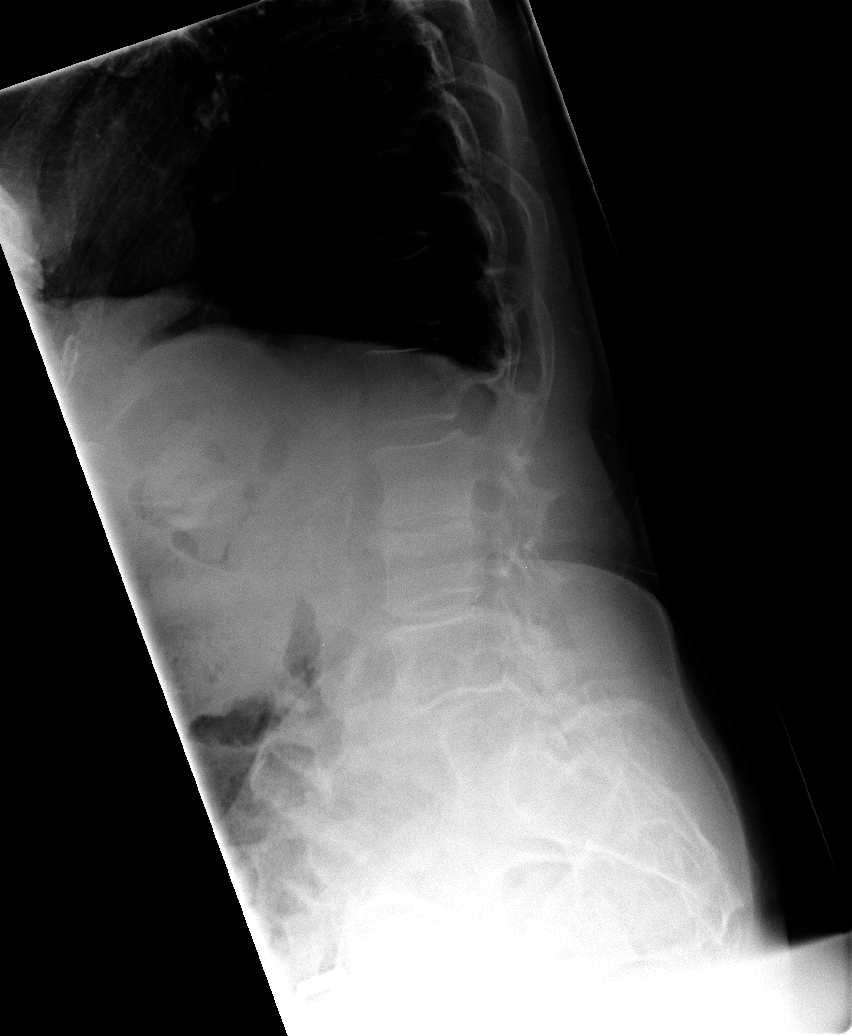

[view not recorded (5 of 5)]
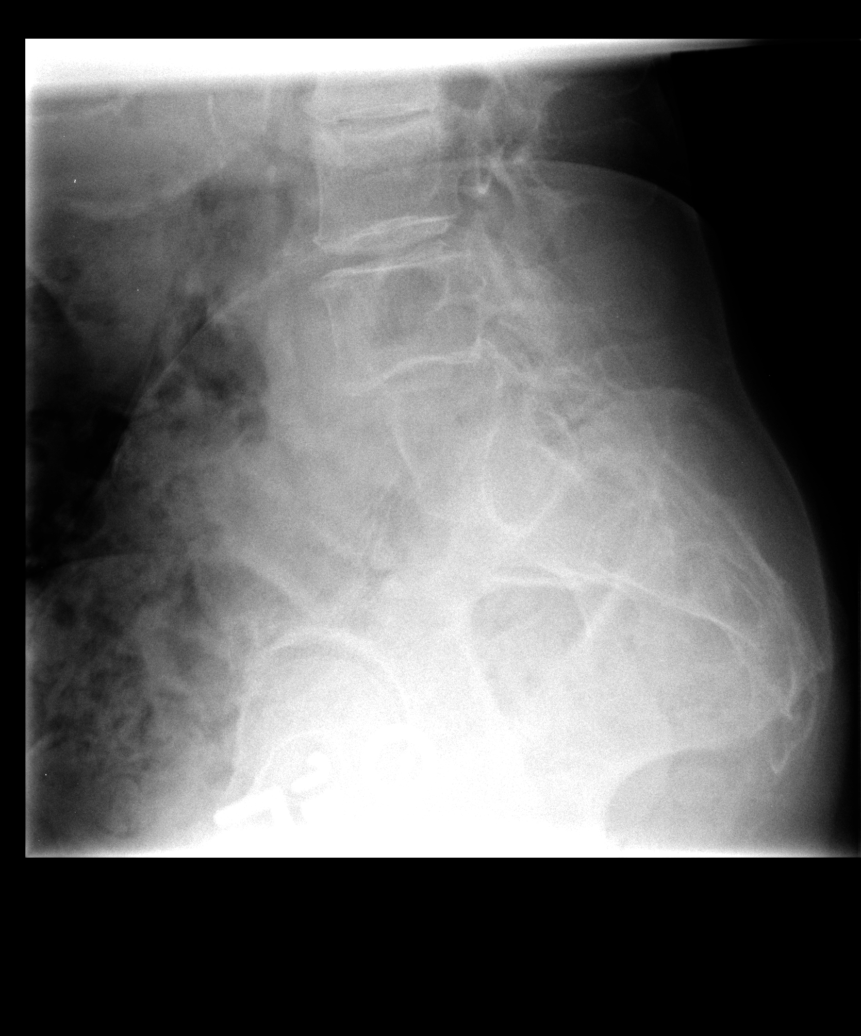

[5 of 5 positions shown; findings below may reference images not displayed]

FINDINGS: There is a mild lumbar curvature convex left of 14 degrees centered
at the L2-3 level. There is anterolisthesis of L4 on L5 by
approximately 10 mm. No definite pars defect is seen. There is
degenerative change involving the facet joints of the lower lumbar
spine. Degenerative disc disease is noted at the L2-3 level.
IMPRESSION: 1. Degenerative disc disease at L2-3.
2. 10 mm anterolisthesis of L4 on L5 most likely due to degenerative
change of the facet joints.
3. Mild curvature of the lumbar spine convex to the left by 14
degrees.

## 2014-11-09 DIAGNOSIS — I10 Essential (primary) hypertension: Secondary | ICD-10-CM | POA: Insufficient documentation

## 2014-11-30 ENCOUNTER — Ambulatory Visit (INDEPENDENT_AMBULATORY_CARE_PROVIDER_SITE_OTHER): Payer: Medicare Other | Admitting: Family Medicine

## 2014-11-30 ENCOUNTER — Ambulatory Visit: Payer: Medicare Other | Admitting: Osteopathic Medicine

## 2014-11-30 VITALS — Temp 98.4°F

## 2014-11-30 DIAGNOSIS — Z23 Encounter for immunization: Secondary | ICD-10-CM

## 2014-11-30 NOTE — Progress Notes (Signed)
   Subjective:    Patient ID: Jessica Dawson, female    DOB: 1945/05/22, 69 y.o.   MRN: [id number]  HPI    Review of Systems     Objective:   Physical Exam        Assessment & Plan:  Here for flu vaccine todayt  [name], MD

## 2014-12-10 DIAGNOSIS — Z8679 Personal history of other diseases of the circulatory system: Secondary | ICD-10-CM | POA: Insufficient documentation

## 2014-12-11 DIAGNOSIS — I4719 Other supraventricular tachycardia: Secondary | ICD-10-CM | POA: Insufficient documentation

## 2014-12-21 ENCOUNTER — Encounter: Payer: Self-pay | Admitting: Family Medicine

## 2014-12-21 ENCOUNTER — Ambulatory Visit (INDEPENDENT_AMBULATORY_CARE_PROVIDER_SITE_OTHER): Payer: Medicare Other | Admitting: Family Medicine

## 2014-12-21 VITALS — BP 158/82 | HR 66 | Wt 113.0 lb

## 2014-12-21 DIAGNOSIS — N951 Menopausal and female climacteric states: Secondary | ICD-10-CM

## 2014-12-21 DIAGNOSIS — I10 Essential (primary) hypertension: Secondary | ICD-10-CM | POA: Diagnosis not present

## 2014-12-21 DIAGNOSIS — E039 Hypothyroidism, unspecified: Secondary | ICD-10-CM | POA: Diagnosis not present

## 2014-12-21 DIAGNOSIS — R232 Flushing: Secondary | ICD-10-CM

## 2014-12-21 MED ORDER — VENLAFAXINE HCL ER 37.5 MG PO CP24: 37.5000 mg | ORAL_CAPSULE | Freq: Every day | ORAL | Status: DC

## 2014-12-21 NOTE — Progress Notes (Signed)
   Subjective:    Patient ID: Jessica Dawson, female    DOB: 1945-07-03, 69 y.o.   MRN: [id number]  HPI Follow-up hypertension. She had cardiac ablation on September 29 for paroxysmal supraventricular tachycardia. She did bring in a log of her blood pressures over the last week. Iris systolic pressure was 142 and the lowest was 102. Diastolics are primarily in the 60-70 range. And pulse typically runs in the 60s to 70s.she has only been taking half a tab of her losartan.   Hypothyroidism-her TSH was mildly elevated around 5 back in June. We increased her levothyroxine to 75 g daily. She is due to repeat thyroid function today. Hair has been falling out.   Still having hotflashes. Says she says it has been terrible. She is considering HRT.  Says the black cohash.   Review of Systems     Objective:   Physical Exam  Constitutional: She is oriented to person, place, and time. She appears well-developed and well-nourished.  HENT:  Head: Normocephalic and atraumatic.  Cardiovascular: Normal rate, regular rhythm and normal heart sounds.   Pulmonary/Chest: Effort normal and breath sounds normal.  Neurological: She is alert and oriented to person, place, and time.  Skin: Skin is warm and dry.  Psychiatric: She has a normal mood and affect. Her behavior is normal.          Assessment & Plan:  Hypertension-uncontrolled. Increase losartan to half a tab twice a day. Though her home blood pressures overall look fantastic. We'll continue to keep an eye on this. Follow-up again in 2-3 months.  Hypothyroidism -recheck TSH and adjust if needed. Hopefully the TSH looks great on the new dose. In then we can recheck it in 6 months. Next  Hot flashes-discussed several options and we did discuss that there is an increased risk of heart issues with being on hormone replacement therapy. She is Re: Tried several of the over-the-counter. We discussed Effexor as a way to help control hot flashes

## 2014-12-22 ENCOUNTER — Telehealth: Payer: Self-pay

## 2014-12-22 LAB — COMPLETE METABOLIC PANEL WITH GFR
ALBUMIN: 4.3 g/dL (ref 3.6–5.1)
ALT: 16 U/L (ref 6–29)
AST: 19 U/L (ref 10–35)
Alkaline Phosphatase: 77 U/L (ref 33–130)
BUN: 18 mg/dL (ref 7–25)
CALCIUM: 9.8 mg/dL (ref 8.6–10.4)
CO2: 26 mmol/L (ref 20–31)
Chloride: 104 mmol/L (ref 98–110)
Creat: 0.83 mg/dL (ref 0.60–0.93)
GFR, EST AFRICAN AMERICAN: 82 mL/min (ref >=60)
GFR, EST NON AFRICAN AMERICAN: 71 mL/min (ref >=60)
Glucose, Bld: 80 mg/dL (ref 65–99)
POTASSIUM: 5 mmol/L (ref 3.5–5.3)
SODIUM: 141 mmol/L (ref 135–146)
TOTAL PROTEIN: 6.9 g/dL (ref 6.1–8.1)
Total Bilirubin: 0.3 mg/dL (ref 0.2–1.2)

## 2014-12-22 LAB — TSH: TSH: 0.796 u[IU]/mL (ref 0.350–4.500)

## 2014-12-22 NOTE — Telephone Encounter (Signed)
Pt notified of results/ no questions or concerns at this time.

## 2014-12-22 NOTE — Telephone Encounter (Signed)
-----   Message from Agapito Games, MD sent at 12/22/2014  7:46 AM EDT ----- Call patient: Thyroid looks great. Metabolic panel including liver and kidney function is normal.

## 2015-01-04 ENCOUNTER — Other Ambulatory Visit: Payer: Self-pay | Admitting: *Deleted

## 2015-01-04 MED ORDER — LEVOTHYROXINE SODIUM 75 MCG PO TABS: 75.0000 ug | ORAL_TABLET | Freq: Every day | ORAL | Status: DC

## 2015-01-14 ENCOUNTER — Ambulatory Visit (INDEPENDENT_AMBULATORY_CARE_PROVIDER_SITE_OTHER): Payer: Medicare Other | Admitting: Family Medicine

## 2015-01-14 ENCOUNTER — Ambulatory Visit (INDEPENDENT_AMBULATORY_CARE_PROVIDER_SITE_OTHER): Payer: Medicare Other

## 2015-01-14 ENCOUNTER — Encounter: Payer: Self-pay | Admitting: Family Medicine

## 2015-01-14 VITALS — BP 154/70 | HR 63 | Temp 98.1°F | Resp 16 | Wt 113.3 lb

## 2015-01-14 DIAGNOSIS — Z1231 Encounter for screening mammogram for malignant neoplasm of breast: Secondary | ICD-10-CM

## 2015-01-14 DIAGNOSIS — R928 Other abnormal and inconclusive findings on diagnostic imaging of breast: Secondary | ICD-10-CM

## 2015-01-14 DIAGNOSIS — Z23 Encounter for immunization: Secondary | ICD-10-CM | POA: Diagnosis not present

## 2015-01-14 DIAGNOSIS — Z1159 Encounter for screening for other viral diseases: Secondary | ICD-10-CM

## 2015-01-14 DIAGNOSIS — Z0000 Encounter for general adult medical examination without abnormal findings: Secondary | ICD-10-CM

## 2015-01-14 NOTE — Progress Notes (Signed)
Subjective:    Jessica Dawson is a 69 y.o. female who presents for Medicare Annual/Subsequent preventive examination.  Preventive Screening-Counseling & Management  Tobacco History  Smoking status  . Former Smoker  Smokeless tobacco  . Not on file     Problems Prior to Visit 1.   Current Problems (verified) Patient Active Problem List   Diagnosis Date Noted  . Low back strain 02/25/2014  . Osteoporosis 12/12/2011  . HYPERLIPIDEMIA 10/05/2008  . PSVT (paroxysmal supraventricular tachycardia) (HCC) 06/16/2008  . Left lumbar radiculitis 05/19/2008  . Hypothyroidism 04/28/2008  . ESSENTIAL HYPERTENSION, BENIGN 04/28/2008  . GERD 04/28/2008    Medications Prior to Visit Current Outpatient Prescriptions on File Prior to Visit  Medication Sig Dispense Refill  . aspirin 81 MG tablet Take 81 mg by mouth daily.      . Calcium-Vitamin D (CALTRATE 600 PLUS-VIT D PO) Take by mouth.      . carvedilol (COREG) 3.125 MG tablet Take 3.125 mg by mouth 2 (two) times daily with a meal.    . clotrimazole (MYCELEX) 10 MG troche Take 1 tablet (10 mg total) by mouth 5 (five) times daily. 140 tablet 2  . cyclobenzaprine (FLEXERIL) 10 MG tablet Take 0.5-1 tablets (5-10 mg total) by mouth at bedtime as needed for muscle spasms. 30 tablet 2  . gabapentin (NEURONTIN) 300 MG capsule Take 1 capsule (300 mg total) by mouth at bedtime. 90 capsule 1  . levothyroxine (SYNTHROID, LEVOTHROID) 75 MCG tablet Take 1 tablet (75 mcg total) by mouth daily. 90 tablet 1  . losartan (COZAAR) 100 MG tablet Take 1 tablet (100 mg total) by mouth daily. (Patient taking differently: Take 50 mg by mouth 2 (two) times daily. Taking 1/2 (50mg ) tablet twice a day) 90 tablet 1  . Multiple Vitamins-Minerals (CENTRUM SILVER PO) Take by mouth.      . naproxen (NAPROSYN) 500 MG tablet TAKE 1 BY MOUTH TWICE DAILY WITH A MEAL 180 tablet 1  . omeprazole (PRILOSEC) 40 MG capsule TAKE 1 BY MOUTH DAILY 90 capsule 0  . venlafaxine XR  (EFFEXOR XR) 37.5 MG 24 hr capsule Take 1 capsule (37.5 mg total) by mouth daily. 30 capsule 2   No current facility-administered medications on file prior to visit.    Current Medications (verified) Current Outpatient Prescriptions  Medication Sig Dispense Refill  . aspirin 81 MG tablet Take 81 mg by mouth daily.      . Calcium-Vitamin D (CALTRATE 600 PLUS-VIT D PO) Take by mouth.      . carvedilol (COREG) 3.125 MG tablet Take 3.125 mg by mouth 2 (two) times daily with a meal.    . clotrimazole (MYCELEX) 10 MG troche Take 1 tablet (10 mg total) by mouth 5 (five) times daily. 140 tablet 2  . cyclobenzaprine (FLEXERIL) 10 MG tablet Take 0.5-1 tablets (5-10 mg total) by mouth at bedtime as needed for muscle spasms. 30 tablet 2  . gabapentin (NEURONTIN) 300 MG capsule Take 1 capsule (300 mg total) by mouth at bedtime. 90 capsule 1  . levothyroxine (SYNTHROID, LEVOTHROID) 75 MCG tablet Take 1 tablet (75 mcg total) by mouth daily. 90 tablet 1  . losartan (COZAAR) 100 MG tablet Take 1 tablet (100 mg total) by mouth daily. (Patient taking differently: Take 50 mg by mouth 2 (two) times daily. Taking 1/2 (50mg ) tablet twice a day) 90 tablet 1  . Multiple Vitamins-Minerals (CENTRUM SILVER PO) Take by mouth.      . naproxen (NAPROSYN) 500 MG  tablet TAKE 1 BY MOUTH TWICE DAILY WITH A MEAL 180 tablet 1  . omeprazole (PRILOSEC) 40 MG capsule TAKE 1 BY MOUTH DAILY 90 capsule 0  . venlafaxine XR (EFFEXOR XR) 37.5 MG 24 hr capsule Take 1 capsule (37.5 mg total) by mouth daily. 30 capsule 2   No current facility-administered medications for this visit.     Allergies (verified) Crestor; Lipitor; Livalo; and Pravastatin sodium   PAST HISTORY  Family History Family History  Problem Relation Age of Onset  . Heart disease Father   . Hyperlipidemia Sister   . Hypertension Sister     Social History Social History  Substance Use Topics  . Smoking status: Former Games developer  . Smokeless tobacco: Not on file   . Alcohol Use: No     Are there smokers in your home (other than you)? No  Risk Factors Current exercise habits: walking most daya  Dietary issues discussed: None   Cardiac risk factors: advanced age (older than 20 for men, 45 for women) and hypertension.  Depression Screen (Note: if answer to either of the following is "Yes", a more complete depression screening is indicated)   Over the past two weeks, have you felt down, depressed or hopeless? No  Over the past two weeks, have you felt little interest or pleasure in doing things? No  Have you lost interest or pleasure in daily life? No  Do you often feel hopeless? No  Do you cry easily over simple problems? Yes  Activities of Daily Living In your present state of health, do you have any difficulty performing the following activities?:  Driving? Yes Managing money?  No Feeding yourself? No Getting from bed to chair? No   Climbing a flight of stairs? No Preparing food and eating?: No Bathing or showering? No Getting dressed: No Getting to the toilet? No Using the toilet:No Moving around from place to place: No In the past year have you fallen or had a near fall?:No   Are you sexually active?  No  Do you have more than one partner?  No  Hearing Difficulties: Yes Do you often ask people to speak up or repeat themselves? Yes Do you experience ringing or noises in your ears? No Do you have difficulty understanding soft or whispered voices? Yes   Do you feel that you have a problem with memory? Yes  Do you often misplace items? No  Do you feel safe at home?  Yes  Cognitive Testing  Alert? Yes  Normal Appearance?Yes  Oriented to person? Yes  Place? Yes   Time? Yes  Recall of three objects?  Yes  Can perform simple calculations? Yes  Displays appropriate judgment?Yes  Can read the correct time from a watch face?Yes   Advanced Directives have been discussed with the patient? Yes  List the Names of Other  Physician/Practitioners you currently use: 1.    Indicate any recent Medical Services you may have received from other than [facility] providers in the past year (date may be approximate).  Immunization History  Administered Date(s) Administered  . Influenza Split 11/27/2011  . Influenza Whole 12/12/2007, 12/23/2008, 12/13/2009, 10/31/2010  . Influenza,inj,Quad PF,36+ Mos 12/09/2012, 12/10/2013, 11/30/2014  . Pneumococcal Conjugate-13 08/24/2014  . Pneumococcal Polysaccharide-23 10/30/2011  . Tdap 10/31/2010  . Zoster 05/11/2012    Screening Tests Health Maintenance  Topic Date Due  . Hepatitis C Screening  09-08-1945  . MAMMOGRAM  11/20/2013  . INFLUENZA VACCINE  10/12/2015  . TETANUS/TDAP  10/30/2020  .  COLONOSCOPY  01/24/2021  . DEXA SCAN  Completed  . ZOSTAVAX  Completed  . PNA vac Low Risk Adult  Completed    All answers were reviewed with the patient and necessary referrals were made:  Shyniece Scripter, MD   01/14/2015   History reviewed: allergies, current medications, past family history, past medical history, past social history, past surgical history and problem list  Review of Systems Pertinent items noted in HPI and remainder of comprehensive ROS otherwise negative.    Objective:     Vision by Snellen chart:eye exam scheduled   Body mass index is 22.87 kg/(m^2). BP 154/70 mmHg  Pulse 63  Temp(Src) 98.1 F (36.7 C) (Oral)  Resp 16  Wt 113 lb 4.8 oz (51.393 kg)  SpO2 100%  BP 154/70 mmHg  Pulse 63  Temp(Src) 98.1 F (36.7 C) (Oral)  Resp 16  Wt 113 lb 4.8 oz (51.393 kg)  SpO2 100% General appearance: alert, cooperative and appears stated age Head: Normocephalic, without obvious abnormality, atraumatic Eyes: cnj clear, EOMi, PEERLA Ears: normal TM's and external ear canals both ears Nose: Nares normal. Septum midline. Mucosa normal. No drainage or sinus tenderness. Throat: lips, mucosa, and tongue normal; teeth and gums normal Neck: no adenopathy,  no carotid bruit, no JVD, supple, symmetrical, trachea midline and thyroid not enlarged, symmetric, no tenderness/mass/nodules Back: symmetric, no curvature. ROM normal. No CVA tenderness. Lungs: clear to auscultation bilaterally Breasts: normal appearance, no masses or tenderness Heart: regular rate and rhythm, S1, S2 normal, no murmur, click, rub or gallop Abdomen: soft, non-tender; bowel sounds normal; no masses,  no organomegaly Extremities: extremities normal, atraumatic, no cyanosis or edema Pulses: 2+ and symmetric Skin: Skin color, texture, turgor normal. No rashes or lesions Lymph nodes: Cervical, supraclavicular, and axillary nodes normal. Neurologic: Alert and oriented X 3, normal strength and tone. Normal symmetric reflexes. Normal coordination and gait     Assessment:     Medicare Wellness Exam       Plan:     During the course of the visit the patient was educated and counseled about appropriate screening and preventive services including:    Screening mammography  Diet review for nutrition referral? Yes ____  Not Indicated __X__   Patient Instructions (the written plan) was given to the patient.  Medicare Attestation I have personally reviewed: The patient's medical and social history Their use of alcohol, tobacco or illicit drugs Their current medications and supplements The patient's functional ability including ADLs,fall risks, home safety risks, cognitive, and hearing and visual impairment Diet and physical activities Evidence for depression or mood disorders  The patient's weight, height, BMI, and visual acuity have been recorded in the chart.  I have made referrals, counseling, and provided education to the patient based on review of the above and I have provided the patient with a written personalized care plan for preventive services.     Everlee Quakenbush, MD   01/14/2015

## 2015-01-14 NOTE — Patient Instructions (Signed)
Keep up a regular exercise program and make sure you are eating a healthy diet Try to eat 4 servings of dairy a day, or if you are lactose intolerant take a calcium with vitamin D daily.  Your vaccines are up to date.   

## 2015-01-15 LAB — HEPATITIS C ANTIBODY: HCV AB: NEGATIVE

## 2015-01-19 ENCOUNTER — Other Ambulatory Visit: Payer: Self-pay | Admitting: Family Medicine

## 2015-01-19 DIAGNOSIS — R928 Other abnormal and inconclusive findings on diagnostic imaging of breast: Secondary | ICD-10-CM

## 2015-01-25 ENCOUNTER — Other Ambulatory Visit: Payer: Self-pay | Admitting: Family Medicine

## 2015-02-01 ENCOUNTER — Ambulatory Visit
Admission: RE | Admit: 2015-02-01 | Discharge: 2015-02-01 | Disposition: A | Discharge status: Home / Self Care | Payer: Medicare Other | Source: Ambulatory Visit | Attending: Family Medicine | Admitting: Family Medicine

## 2015-02-01 ENCOUNTER — Other Ambulatory Visit: Payer: Medicare Other

## 2015-02-01 DIAGNOSIS — R928 Other abnormal and inconclusive findings on diagnostic imaging of breast: Secondary | ICD-10-CM

## 2015-02-22 ENCOUNTER — Other Ambulatory Visit: Payer: Self-pay | Admitting: Family Medicine

## 2015-05-20 ENCOUNTER — Other Ambulatory Visit: Payer: Self-pay | Admitting: Family Medicine

## 2015-05-20 DIAGNOSIS — I471 Supraventricular tachycardia: Secondary | ICD-10-CM

## 2015-05-24 DIAGNOSIS — H18519 Endothelial corneal dystrophy, unspecified eye: Secondary | ICD-10-CM | POA: Insufficient documentation

## 2015-05-24 DIAGNOSIS — H26493 Other secondary cataract, bilateral: Secondary | ICD-10-CM | POA: Insufficient documentation

## 2015-06-10 ENCOUNTER — Ambulatory Visit: Payer: Medicare Other | Admitting: Family Medicine

## 2015-06-16 ENCOUNTER — Ambulatory Visit (INDEPENDENT_AMBULATORY_CARE_PROVIDER_SITE_OTHER): Payer: Medicare Other | Admitting: Cardiology

## 2015-06-16 ENCOUNTER — Encounter: Payer: Self-pay | Admitting: Cardiology

## 2015-06-16 VITALS — BP 180/84 | HR 53 | Ht 59.0 in | Wt 113.0 lb

## 2015-06-16 DIAGNOSIS — R002 Palpitations: Secondary | ICD-10-CM | POA: Diagnosis not present

## 2015-06-16 DIAGNOSIS — I10 Essential (primary) hypertension: Secondary | ICD-10-CM | POA: Diagnosis not present

## 2015-06-16 DIAGNOSIS — I471 Supraventricular tachycardia: Secondary | ICD-10-CM

## 2015-06-16 MED ORDER — CARVEDILOL 3.125 MG PO TABS: 3.1250 mg | ORAL_TABLET | Freq: Two times a day (BID) | ORAL | Status: DC

## 2015-06-16 NOTE — Progress Notes (Signed)
HPI: 70 year old female for evaluation of SVT. 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 The patient had had SVT for years prior to her procedure. Since that time she has had occasional skips but no sustained arrhythmias similar to her SVT. She has mild dyspnea on exertion but no orthopnea, PND, pedal edema, chest pain or syncope.  Current Outpatient Prescriptions  Medication Sig Dispense Refill  . aspirin 81 MG tablet Take 81 mg by mouth daily.      . Calcium-Vitamin D (CALTRATE 600 PLUS-VIT D PO) Take by mouth.      . clotrimazole (MYCELEX) 10 MG troche Take 1 tablet (10 mg total) by mouth 5 (five) times daily. 140 tablet 2  . cyclobenzaprine (FLEXERIL) 10 MG tablet Take 0.5-1 tablets (5-10 mg total) by mouth at bedtime as needed for muscle spasms. 30 tablet 2  . gabapentin (NEURONTIN) 300 MG capsule TAKE 1 ( ) BY MOUTH AT BEDTIME 90 capsule 0  . levothyroxine (SYNTHROID, LEVOTHROID) 75 MCG tablet Take 1 tablet (75 mcg total) by mouth daily. 90 tablet 1  . losartan (COZAAR) 100 MG tablet TAKE 1 ( ) BY MOUTH DAILY 90 tablet 0  . Multiple Vitamins-Minerals (CENTRUM SILVER PO) Take by mouth.      . naproxen (NAPROSYN) 500 MG tablet TAKE 1 BY MOUTH TWICE DAILY WITH A MEAL 180 tablet 1  . omeprazole (PRILOSEC) 40 MG capsule TAKE 1 BY MOUTH DAILY 90 capsule 0  . carvedilol (COREG) 3.125 MG tablet Take 1 tablet (3.125 mg total) by mouth 2 (two) times daily with a meal. 180 tablet 3   No current facility-administered medications for this visit.    Allergies  Allergen Reactions  . Crestor [Rosuvastatin] Other (See Comments)    Joint aches   . Lipitor [Atorvastatin] Other (See Comments)    Muscle cramps   . Livalo [Pitavastatin] Other (See Comments)    constipation  . Pravastatin Sodium Other (See Comments)    REACTION: Myalgias     Past Medical History  Diagnosis Date  . Hypertension   . SVT (supraventricular tachycardia)  (HCC)   . Hyperlipidemia   . Hypothyroid   . GERD (gastroesophageal reflux disease)   . Hiatal hernia     Past Surgical History  Procedure Laterality Date  . Ablation    . Cataract extraction      Social History   Social History  . Marital Status: Married    Spouse Name: N/A  . Number of Children: 3  . Years of Education: N/A   Occupational History  . Not on file.   Social History Main Topics  . Smoking status: Former Games developer  . Smokeless tobacco: Not on file  . Alcohol Use: No  . Drug Use: No  . Sexual Activity: Not on file   Other Topics Concern  . Not on file   Social History Narrative    Family History  Problem Relation Age of Onset  . Heart disease Father     MI at age 44  . Hyperlipidemia Sister   . Hypertension Sister     ROS: no fevers or chills, productive cough, hemoptysis, dysphasia, odynophagia, melena, hematochezia, dysuria, hematuria, rash, seizure activity, orthopnea, PND, pedal edema, claudication. Remaining systems are negative.  Physical Exam:   Blood pressure 180/84, pulse 53, height  (1.499 m), weight 113 lb (51.256 kg).  General:  Well developed/well nourished in NAD Skin warm/dry Patient not depressed No peripheral  clubbing Back-normal HEENT-normal/normal eyelids Neck supple/normal carotid upstroke bilaterally; no bruits; no JVD; no thyromegaly chest - CTA/ normal expansion CV - RRR/normal S1 and S2; no murmurs, rubs or gallops;  PMI nondisplaced Abdomen -NT/ND, no HSM, no mass, + bowel sounds, no bruit 2+ femoral pulses, no bruits Ext-no edema, chords, 2+ DP Neuro-grossly nonfocal  ECG Sinus bradycardia at a rate of 53. Right bundle branch block. Left anterior fascicular block.

## 2015-06-16 NOTE — Assessment & Plan Note (Signed)
Patient has done well since her ablation. We will continue beta blockade. I will see her back in one year or sooner if she develops recurrent arrhythmias.

## 2015-06-16 NOTE — Assessment & Plan Note (Signed)
Blood pressure is elevated. However she states at home it is controlled. Her systolic this morning was in the 110-120 range. Continue present medications and follow.

## 2015-06-16 NOTE — Assessment & Plan Note (Signed)
Patient has been intolerant to statins. Continue diet. Management per primary care.

## 2015-06-16 NOTE — Patient Instructions (Signed)
Your physician wants you to follow-up in: ONE YEAR WITH DR CRENSHAW You will receive a reminder letter in the mail two months in advance. If you don't receive a letter, please call our office to schedule the follow-up appointment.   If you need a refill on your cardiac medications before your next appointment, please call your pharmacy.  

## 2015-06-21 ENCOUNTER — Encounter: Payer: Self-pay | Admitting: Family Medicine

## 2015-06-21 ENCOUNTER — Ambulatory Visit (INDEPENDENT_AMBULATORY_CARE_PROVIDER_SITE_OTHER): Payer: Medicare Other | Admitting: Family Medicine

## 2015-06-21 VITALS — BP 149/56 | HR 64 | Wt 112.0 lb

## 2015-06-21 DIAGNOSIS — J040 Acute laryngitis: Secondary | ICD-10-CM

## 2015-06-21 DIAGNOSIS — J209 Acute bronchitis, unspecified: Secondary | ICD-10-CM | POA: Diagnosis not present

## 2015-06-21 MED ORDER — LEVOTHYROXINE SODIUM 75 MCG PO TABS: 75.0000 ug | ORAL_TABLET | Freq: Every day | ORAL | Status: DC

## 2015-06-21 MED ORDER — NAPROXEN 500 MG PO TABS: ORAL_TABLET | ORAL | Status: DC

## 2015-06-21 NOTE — Patient Instructions (Signed)
Laryngitis  Laryngitis is inflammation of your vocal cords. This causes hoarseness, coughing, loss of voice, sore throat, or a dry throat. Your vocal cords are two bands of muscles that are found in your throat. When you speak, these cords come together and vibrate. These vibrations come out through your mouth as sound. When your vocal cords are inflamed, your voice sounds different.  Laryngitis can be temporary (acute) or long-term (chronic). Most cases of acute laryngitis improve with time. Chronic laryngitis is laryngitis that lasts for more than three weeks.  CAUSES  Acute laryngitis may be caused by:   A viral infection.   Lots of talking, yelling, or singing. This is also called vocal strain.   Bacterial infections.  Chronic laryngitis may be caused by:   Vocal strain.   Injury to your vocal cords.   Acid reflux (gastroesophageal reflux disease or GERD).   Allergies.   Sinus infection.   Smoking.   Alcohol abuse.   Breathing in chemicals or dust.   Growths on the vocal cords.  RISK FACTORS  Risk factors for laryngitis include:   Smoking.   Alcohol abuse.   Having allergies.  SIGNS AND SYMPTOMS  Symptoms of laryngitis may include:   Low, hoarse voice.   Loss of voice.   Dry cough.   Sore throat.   Stuffy nose.  DIAGNOSIS  Laryngitis may be diagnosed by:   Physical exam.   Throat culture.   Blood test.   Laryngoscopy. This procedure allows your health care provider to look at your vocal cords with a mirror or viewing tube.  TREATMENT  Treatment for laryngitis depends on what is causing it. Usually, treatment involves resting your voice and using medicines to soothe your throat. However, if your laryngitis is caused by a bacterial infection, you may need to take antibiotic medicine. If your laryngitis is caused by a growth, you may need to have a procedure to remove it.  HOME CARE INSTRUCTIONS   Drink enough fluid to keep your urine clear or pale yellow.   Breathe in moist air. Use a  humidifier if you live in a dry climate.   Take medicines only as directed by your health care provider.   If you were prescribed an antibiotic medicine, finish it all even if you start to feel better.   Do not smoke cigarettes or electronic cigarettes. If you need help quitting, ask your health care provider.   Talk as little as possible. Also avoid whispering, which can cause vocal strain.   Write instead of talking. Do this until your voice is back to normal.  SEEK MEDICAL CARE IF:   You have a fever.   You have increasing pain.   You have difficulty swallowing.  SEEK IMMEDIATE MEDICAL CARE IF:   You cough up blood.   You have trouble breathing.     This information is not intended to replace advice given to you by your health care provider. Make sure you discuss any questions you have with your health care provider.     Document Released: 02/27/2005 Document Revised: 03/20/2014 Document Reviewed: 08/12/2013  Elsevier Interactive Patient Education 2016 [employer] Inc[employer]

## 2015-06-21 NOTE — Progress Notes (Signed)
   Subjective:    Patient ID: Jessica Dawson, female    DOB: 24-Sep-1945, 70 y.o.   MRN: [id number]  HPI       Cough since 6 days taking tylenol flu. denies fever,chills,bodyaches, did have some green mucous. She had a runny nose initially but that has resolved. No significant nasal congestion. She has had some headaches. Cough is not keeping her awake at night. She has had some nausea but no diarrhea or constipation. Did try some of her husband's cough syrup at says it really wasn't very helpful.       Review of Systems     Objective:   Physical Exam  Constitutional: She is oriented to person, place, and time. She appears well-developed and well-nourished.  HENT:  Head: Normocephalic and atraumatic.  Right Ear: External ear normal.  Left Ear: External ear normal.  Nose: Nose normal.  Mouth/Throat: Oropharynx is clear and moist.  TMs and canals are clear.   Eyes: Conjunctivae and EOM are normal. Pupils are equal, round, and reactive to light.  Neck: Neck supple. No thyromegaly present.  Cardiovascular: Normal rate, regular rhythm and normal heart sounds.   Pulmonary/Chest: Effort normal and breath sounds normal. She has no wheezes.  Lymphadenopathy:    She has no cervical adenopathy.  Neurological: She is alert and oriented to person, place, and time.  Skin: Skin is warm and dry.  Psychiatric: She has a normal mood and affect.          Assessment & Plan:  Acute bronchitis-lung exam is completely clear today. Gave her reassurance that this is a viral illness. Encouraged her to give to the end of the week to try to get better. If she's not improving or suddenly worse than please give us a call back. Okay to continue over-the-counter cough and cold medications. Encourage hydration and salt water gargles.   Laryngitis-see above.

## 2015-07-06 ENCOUNTER — Encounter: Payer: Self-pay | Admitting: Family Medicine

## 2015-07-19 ENCOUNTER — Ambulatory Visit: Payer: Medicare Other | Admitting: Family Medicine

## 2015-07-26 ENCOUNTER — Encounter: Payer: Self-pay | Admitting: Family Medicine

## 2015-07-26 ENCOUNTER — Ambulatory Visit (INDEPENDENT_AMBULATORY_CARE_PROVIDER_SITE_OTHER): Payer: Medicare Other | Admitting: Family Medicine

## 2015-07-26 VITALS — BP 138/64 | HR 66 | Wt 113.0 lb

## 2015-07-26 DIAGNOSIS — E785 Hyperlipidemia, unspecified: Secondary | ICD-10-CM

## 2015-07-26 DIAGNOSIS — E039 Hypothyroidism, unspecified: Secondary | ICD-10-CM

## 2015-07-26 DIAGNOSIS — I10 Essential (primary) hypertension: Secondary | ICD-10-CM

## 2015-07-26 MED ORDER — LOSARTAN POTASSIUM 100 MG PO TABS: ORAL_TABLET | ORAL | Status: DC

## 2015-07-26 MED ORDER — GABAPENTIN 300 MG PO CAPS: ORAL_CAPSULE | ORAL | Status: DC

## 2015-07-26 NOTE — Progress Notes (Addendum)
Subjective:    Patient ID: Jessica Dawson, female    DOB: [birth year], 70 y.o.   MRN: [id number]  HPI Hypertension- Pt denies chest pain, SOB, dizziness, or heart palpitations.  Taking meds as directed w/o problems.  Denies medication side effects.    Hypothyroidism- no changes in weight or energy level.  takingh medication regularly.    Hyperlipidemia-intolerant to statins. Due to recheck lipid levels. She does try to control this with her diet.  Review of Systems  BP 138/64 mmHg  Pulse 66  Wt 113 lb (51.256 kg)  SpO2 99%    Allergies  Allergen Reactions  . Crestor [Rosuvastatin] Other (See Comments)    Joint aches   . Lipitor [Atorvastatin] Other (See Comments)    Muscle cramps   . Livalo [Pitavastatin] Other (See Comments)    constipation  . Pravastatin Sodium Other (See Comments)    REACTION: Myalgias    Past Medical History  Diagnosis Date  . Hypertension   . SVT (supraventricular tachycardia) (HCC)   . Hyperlipidemia   . Hypothyroid   . GERD (gastroesophageal reflux disease)   . Hiatal hernia     Past Surgical History  Procedure Laterality Date  . Ablation    . Cataract extraction      Social History   Social History  . Marital Status: Married    Spouse Name: N/A  . Number of Children: 3  . Years of Education: N/A   Occupational History  . Not on file.   Social History Main Topics  . Smoking status: Former Games developermoker  . Smokeless tobacco: Not on file  . Alcohol Use: No  . Drug Use: No  . Sexual Activity: Not on file   Other Topics Concern  . Not on file   Social History Narrative    Family History  Problem Relation Age of Onset  . Heart disease Father     MI at age 72  . Hyperlipidemia Sister   . Hypertension Sister     Outpatient Encounter Prescriptions as of 07/26/2015  Medication Sig  . aspirin 81 MG tablet Take 81 mg by mouth daily.    . Calcium-Vitamin D (CALTRATE 600 PLUS-VIT D PO) Take by mouth.    . carvedilol (COREG) 3.125  MG tablet Take 1 tablet (3.125 mg total) by mouth 2 (two) times daily with a meal.  . clotrimazole (MYCELEX) 10 MG troche Take 1 tablet (10 mg total) by mouth 5 (five) times daily.  . cyclobenzaprine (FLEXERIL) 10 MG tablet Take 0.5-1 tablets (5-10 mg total) by mouth at bedtime as needed for muscle spasms.  Marland Kitchen. gabapentin (NEURONTIN) 300 MG capsule TAKE 1 (300MG ) BY MOUTH AT BEDTIME  . levothyroxine (SYNTHROID, LEVOTHROID) 75 MCG tablet Take 1 tablet (75 mcg total) by mouth daily.  Marland Kitchen. losartan (COZAAR) 100 MG tablet TAKE 1 (100MG ) BY MOUTH DAILY  . Multiple Vitamins-Minerals (CENTRUM SILVER PO) Take by mouth.    . naproxen (NAPROSYN) 500 MG tablet TAKE 1 BY MOUTH TWICE DAILY WITH A MEAL  . omeprazole (PRILOSEC) 40 MG capsule TAKE 1 BY MOUTH DAILY  . [DISCONTINUED] gabapentin (NEURONTIN) 300 MG capsule TAKE 1 (300MG ) BY MOUTH AT BEDTIME  . [DISCONTINUED] losartan (COZAAR) 100 MG tablet TAKE 1 (100MG ) BY MOUTH DAILY   No facility-administered encounter medications on file as of 07/26/2015.          Objective:   Physical Exam  Constitutional: She is oriented to person, place, and time. She appears well-developed and  well-nourished.  HENT:  Head: Normocephalic and atraumatic.  Cardiovascular: Normal rate, regular rhythm and normal heart sounds.   Pulmonary/Chest: Effort normal and breath sounds normal.  Neurological: She is alert and oriented to person, place, and time.  Skin: Skin is warm and dry.  Psychiatric: She has a normal mood and affect. Her behavior is normal.        Assessment & Plan:  HTN - Well controlled. Continue current regimen. Due for metabolic panel and lipids next month. Lab slip provided today. Follow up in 6 months.    Hypothyroidism - will recheck level next mojths.   Hyperlipidemia-due to recheck lipids.

## 2015-08-26 LAB — COMPLETE METABOLIC PANEL WITH GFR
ALK PHOS: 66 U/L (ref 33–130)
ALT: 12 U/L (ref 6–29)
AST: 20 U/L (ref 10–35)
Albumin: 4.2 g/dL (ref 3.6–5.1)
BILIRUBIN TOTAL: 0.5 mg/dL (ref 0.2–1.2)
BUN: 24 mg/dL (ref 7–25)
CO2: 19 mmol/L — ABNORMAL LOW (ref 20–31)
Calcium: 9.3 mg/dL (ref 8.6–10.4)
Chloride: 104 mmol/L (ref 98–110)
Creat: 1 mg/dL — ABNORMAL HIGH (ref 0.60–0.93)
GFR, EST NON AFRICAN AMERICAN: 56 mL/min — AB (ref >=60)
GFR, Est African American: 65 mL/min (ref >=60)
Glucose, Bld: 79 mg/dL (ref 65–99)
Potassium: 5.3 mmol/L (ref 3.5–5.3)
SODIUM: 139 mmol/L (ref 135–146)
TOTAL PROTEIN: 7.1 g/dL (ref 6.1–8.1)

## 2015-08-26 LAB — LIPID PANEL
CHOL/HDL RATIO: 5.7 ratio — AB (ref <=5.0)
CHOLESTEROL: 280 mg/dL — AB (ref 125–200)
HDL: 49 mg/dL (ref >=46)
LDL Cholesterol: 188 mg/dL — ABNORMAL HIGH (ref <130)
TRIGLYCERIDES: 215 mg/dL — AB (ref <150)
VLDL: 43 mg/dL — ABNORMAL HIGH (ref <30)

## 2015-08-26 LAB — TSH: TSH: 0.79 m[IU]/L

## 2015-08-30 NOTE — Addendum Note (Signed)
Addended by: Deno EtienneBARKLEY, Cailyn Houdek L on: 08/30/2015 07:52 AM   Modules accepted: Orders

## 2015-10-11 LAB — BASIC METABOLIC PANEL WITH GFR
BUN: 23 mg/dL (ref 7–25)
CALCIUM: 9.7 mg/dL (ref 8.6–10.4)
CO2: 21 mmol/L (ref 20–31)
CREATININE: 0.86 mg/dL (ref 0.60–0.93)
Chloride: 105 mmol/L (ref 98–110)
GFR, EST AFRICAN AMERICAN: 78 mL/min (ref >=60)
GFR, Est Non African American: 68 mL/min (ref >=60)
Glucose, Bld: 94 mg/dL (ref 65–99)
Potassium: 4.7 mmol/L (ref 3.5–5.3)
SODIUM: 136 mmol/L (ref 135–146)

## 2015-10-11 NOTE — Progress Notes (Signed)
All labs are normal. 

## 2015-11-29 ENCOUNTER — Ambulatory Visit (INDEPENDENT_AMBULATORY_CARE_PROVIDER_SITE_OTHER): Payer: Medicare Other | Admitting: Family Medicine

## 2015-11-29 VITALS — BP 124/53 | HR 58

## 2015-11-29 DIAGNOSIS — Z23 Encounter for immunization: Secondary | ICD-10-CM | POA: Diagnosis not present

## 2015-11-29 NOTE — Progress Notes (Signed)
Pt here for flu shot. Immunization given pt tolerated well.Loralee PacasBarkley, Happy Begeman ThomastonLynetta

## 2015-12-29 ENCOUNTER — Other Ambulatory Visit: Payer: Self-pay | Admitting: Family Medicine

## 2016-01-24 ENCOUNTER — Ambulatory Visit (INDEPENDENT_AMBULATORY_CARE_PROVIDER_SITE_OTHER): Payer: Medicare Other | Admitting: Family Medicine

## 2016-01-24 ENCOUNTER — Encounter: Payer: Self-pay | Admitting: Family Medicine

## 2016-01-24 VITALS — BP 133/60 | HR 58 | Ht 59.0 in | Wt 113.0 lb

## 2016-01-24 DIAGNOSIS — M5416 Radiculopathy, lumbar region: Secondary | ICD-10-CM | POA: Diagnosis not present

## 2016-01-24 DIAGNOSIS — I10 Essential (primary) hypertension: Secondary | ICD-10-CM

## 2016-01-24 MED ORDER — OMEPRAZOLE 40 MG PO CPDR: DELAYED_RELEASE_CAPSULE | ORAL | 1 refills | Status: DC

## 2016-01-24 NOTE — Progress Notes (Signed)
Subjective:    CC: HTN  HPI: Hypertension- Pt denies chest pain, SOB, dizziness, or heart palpitations.  Taking meds as directed w/o problems.  Denies medication side effects.    She continues to have a lot of chronic low back pain. She says exercise does help some says she's been walking a mile a day. She did look into physical therapy but unfortunately it was chronic cough too much so she is hoping in January to her new insurance will either have a or a lower co-pay for PTE. She does continue to take her naproxen and gabapentin pretty regularly and her muscle relaxer as needed.  Past medical history, Surgical history, Family history not pertinant except as noted below, Social history, Allergies, and medications have been entered into the medical record, reviewed, and corrections made.   Review of Systems: No fevers, chills, night sweats, weight loss, chest pain, or shortness of breath.   Objective:    General: Well Developed, well nourished, and in no acute distress.  Neuro: Alert and oriented x3, extra-ocular muscles intact, sensation grossly intact.  HEENT: Normocephalic, atraumatic  Skin: Warm and dry, no rashes. Cardiac: Regular rate and rhythm, no murmurs rubs or gallops, no lower extremity edema.  Respiratory: Clear to auscultation bilaterally. Not using accessory muscles, speaking in full sentences.   Impression and Recommendations:    HTN - Well controlled. Continue current regimen. Labs are up-to-date. Follow-up in 6 months.  Chronic low back pain/left lumbar radiculopathy-she would really like to engage in physical therapy but the cost as can be quite prohibitive for her. Especially since now she has had her Medicare gap with her and her husband and is having to pay out of pocket a lot more for her medications and his medications. She's having it be better covered in January and she'll be able to do so then. For now continue with gabapentin anti-inflammatory and as needed  muscle relaxer.

## 2016-02-15 ENCOUNTER — Other Ambulatory Visit: Payer: Self-pay | Admitting: Family Medicine

## 2016-05-10 ENCOUNTER — Other Ambulatory Visit: Payer: Self-pay | Admitting: Family Medicine

## 2016-07-12 ENCOUNTER — Other Ambulatory Visit: Payer: Self-pay | Admitting: Family Medicine

## 2016-07-24 ENCOUNTER — Ambulatory Visit (INDEPENDENT_AMBULATORY_CARE_PROVIDER_SITE_OTHER): Payer: PPO | Admitting: Family Medicine

## 2016-07-24 ENCOUNTER — Encounter: Payer: Self-pay | Admitting: Family Medicine

## 2016-07-24 VITALS — BP 130/80 | HR 55 | Ht 59.0 in | Wt 115.0 lb

## 2016-07-24 DIAGNOSIS — I10 Essential (primary) hypertension: Secondary | ICD-10-CM

## 2016-07-24 DIAGNOSIS — T148XXA Other injury of unspecified body region, initial encounter: Secondary | ICD-10-CM

## 2016-07-24 DIAGNOSIS — Z1231 Encounter for screening mammogram for malignant neoplasm of breast: Secondary | ICD-10-CM

## 2016-07-24 DIAGNOSIS — E785 Hyperlipidemia, unspecified: Secondary | ICD-10-CM | POA: Diagnosis not present

## 2016-07-24 DIAGNOSIS — E039 Hypothyroidism, unspecified: Secondary | ICD-10-CM

## 2016-07-24 LAB — TSH: TSH: 3.31 mIU/L

## 2016-07-24 NOTE — Progress Notes (Signed)
Subjective:    Patient ID: Jessica Dawson, female    DOB: 1945/05/02, 71 y.o.   MRN: [id number]  HPI Hypertension- Pt denies chest pain, SOB, dizziness, or heart palpitations.  Taking meds as directed w/o problems.  Denies medication side effects.    Not sleeping well lately. Says she is having a hard time falling asleep.    Hyperlipidemia-intolerant to statins.  Hypothyroid-no recent skin or hair changes. No significant weight changes. Taking medication regularly as prescribed.  He also occasionally gets these blisters on her left hand. She says it pops up amiss like a blood blister and then they tend to go away and just warm a little bruise that's left over and then resolve. She does work out in the yard but doesn't remember any trauma or injury and only seems to affect her left hand. She has a couple of bruises near her distal thumb joint on that left hand and one at the base of her pointer finger. They're nontender and I don't palpate any edema underneath the skin.  Review of Systems   BP 130/80   Pulse (!) 55   Ht 4\' 11"  (1.499 m)   Wt 115 lb (52.2 kg)   SpO2 100%   BMI 23.23 kg/m     Allergies  Allergen Reactions  . Crestor [Rosuvastatin] Other (See Comments)    Joint aches   . Lipitor [Atorvastatin] Other (See Comments)    Muscle cramps   . Livalo [Pitavastatin] Other (See Comments)    constipation  . Pravastatin Sodium Other (See Comments)    REACTION: Myalgias    Past Medical History:  Diagnosis Date  . GERD (gastroesophageal reflux disease)   . Hiatal hernia   . Hyperlipidemia   . Hypertension   . Hypothyroid   . SVT (supraventricular tachycardia) (HCC)     Past Surgical History:  Procedure Laterality Date  . ABLATION    . CATARACT EXTRACTION      Social History   Social History  . Marital status: Married    Spouse name: N/A  . Number of children: 3  . Years of education: N/A   Occupational History  . Not on file.   Social History Main  Topics  . Smoking status: Former Games developermoker  . Smokeless tobacco: Never Used  . Alcohol use No  . Drug use: No  . Sexual activity: Not on file   Other Topics Concern  . Not on file   Social History Narrative  . No narrative on file    Family History  Problem Relation Age of Onset  . Heart disease Father        MI at age 73  . Hyperlipidemia Sister   . Hypertension Sister     Outpatient Encounter Prescriptions as of 07/24/2016  Medication Sig  . aspirin 81 MG tablet Take 81 mg by mouth daily.    . Calcium-Vitamin D (CALTRATE 600 PLUS-VIT D PO) Take by mouth.    . carvedilol (COREG) 3.125 MG tablet Take 1 tablet (3.125 mg total) by mouth 2 (two) times daily with a meal.  . clotrimazole (MYCELEX) 10 MG troche Take 1 tablet (10 mg total) by mouth 5 (five) times daily.  Marland Kitchen. gabapentin (NEURONTIN) 300 MG capsule TAKE ONE CAPSULE BY MOUTH AT BEDTIME  . levothyroxine (SYNTHROID, LEVOTHROID) 75 MCG tablet TAKE ONE TABLET BY MOUTH ONCE DAILY  . losartan (COZAAR) 100 MG tablet TAKE ONE TABLET BY MOUTH ONCE DAILY  . Multiple Vitamins-Minerals (CENTRUM SILVER  PO) Take by mouth.    . naproxen (NAPROSYN) 500 MG tablet TAKE ONE TABLET BY MOUTH TWICE DAILY WITH MEALS  . omeprazole (PRILOSEC) 40 MG capsule TAKE 1 BY MOUTH DAILY  . [DISCONTINUED] cyclobenzaprine (FLEXERIL) 10 MG tablet Take 0.5-1 tablets (5-10 mg total) by mouth at bedtime as needed for muscle spasms.   No facility-administered encounter medications on file as of 07/24/2016.           Objective:   Physical Exam  Constitutional: She is oriented to person, place, and time. She appears well-developed and well-nourished.  HENT:  Head: Normocephalic and atraumatic.  Cardiovascular: Normal rate, regular rhythm and normal heart sounds.   Pulmonary/Chest: Effort normal and breath sounds normal.  Neurological: She is alert and oriented to person, place, and time.  Skin: Skin is warm and dry.  Psychiatric: She has a normal mood and  affect. Her behavior is normal.         Assessment & Plan:  HTN - Well controlled. Continue current regimen. Follow up in   Hypothyroidism-due to recheck TSH.  Hyperlipidemia-due to recheck lipids.  Rash/bruising on left hand-unclear etiology. Right now they just look like small little bruised areas that don't see any vesicles or blisters on exam. Encouraged her to keep an eye on it and let me know if it continues.  Due for screening mammogram-will place order for downstairs.

## 2016-07-25 LAB — COMPLETE METABOLIC PANEL WITH GFR
ALBUMIN: 4.4 g/dL (ref 3.6–5.1)
ALK PHOS: 60 U/L (ref 33–130)
ALT: 15 U/L (ref 6–29)
AST: 23 U/L (ref 10–35)
BUN: 32 mg/dL — ABNORMAL HIGH (ref 7–25)
CALCIUM: 9.7 mg/dL (ref 8.6–10.4)
CHLORIDE: 107 mmol/L (ref 98–110)
CO2: 22 mmol/L (ref 20–31)
Creat: 1.11 mg/dL — ABNORMAL HIGH (ref 0.60–0.93)
GFR, EST NON AFRICAN AMERICAN: 49 mL/min — AB (ref >=60)
GFR, Est African American: 57 mL/min — ABNORMAL LOW (ref >=60)
GLUCOSE: 89 mg/dL (ref 65–99)
POTASSIUM: 4.8 mmol/L (ref 3.5–5.3)
SODIUM: 141 mmol/L (ref 135–146)
Total Bilirubin: 0.4 mg/dL (ref 0.2–1.2)
Total Protein: 7.2 g/dL (ref 6.1–8.1)

## 2016-07-25 LAB — LIPID PANEL W/REFLEX DIRECT LDL
CHOL/HDL RATIO: 5.8 ratio — AB (ref <5.0)
CHOLESTEROL: 302 mg/dL — AB (ref <200)
HDL: 52 mg/dL (ref >50)
LDL-Cholesterol: 214 mg/dL — ABNORMAL HIGH
Non-HDL Cholesterol (Calc): 250 mg/dL — ABNORMAL HIGH (ref <130)
TRIGLYCERIDES: 183 mg/dL — AB (ref <150)

## 2016-08-16 ENCOUNTER — Ambulatory Visit: Payer: PPO

## 2016-08-23 ENCOUNTER — Ambulatory Visit (INDEPENDENT_AMBULATORY_CARE_PROVIDER_SITE_OTHER): Payer: PPO

## 2016-08-23 ENCOUNTER — Other Ambulatory Visit: Payer: Self-pay | Admitting: Family Medicine

## 2016-08-23 DIAGNOSIS — Z1231 Encounter for screening mammogram for malignant neoplasm of breast: Secondary | ICD-10-CM | POA: Diagnosis not present

## 2016-09-27 ENCOUNTER — Other Ambulatory Visit: Payer: Self-pay | Admitting: Family Medicine

## 2016-11-01 ENCOUNTER — Other Ambulatory Visit: Payer: Self-pay | Admitting: Family Medicine

## 2016-11-01 ENCOUNTER — Other Ambulatory Visit: Payer: Self-pay | Admitting: Cardiology

## 2016-11-01 NOTE — Telephone Encounter (Signed)
Rx(s) sent to pharmacy electronically.  

## 2016-12-04 ENCOUNTER — Ambulatory Visit (INDEPENDENT_AMBULATORY_CARE_PROVIDER_SITE_OTHER): Payer: PPO | Admitting: Family Medicine

## 2016-12-04 VITALS — Temp 98.2°F

## 2016-12-04 DIAGNOSIS — Z23 Encounter for immunization: Secondary | ICD-10-CM

## 2016-12-04 NOTE — Progress Notes (Signed)
Pt here for flu shot. Afebrile,no recent illness. Vaccination given, pt tolerated well..Jessica Dawson  

## 2016-12-06 ENCOUNTER — Ambulatory Visit: Payer: PPO | Admitting: Cardiology

## 2017-01-03 ENCOUNTER — Other Ambulatory Visit: Payer: Self-pay | Admitting: *Deleted

## 2017-01-03 ENCOUNTER — Other Ambulatory Visit: Payer: Self-pay | Admitting: Family Medicine

## 2017-01-26 ENCOUNTER — Encounter: Payer: Self-pay | Admitting: Cardiology

## 2017-01-29 ENCOUNTER — Other Ambulatory Visit: Payer: Self-pay | Admitting: Family Medicine

## 2017-01-29 ENCOUNTER — Ambulatory Visit: Payer: PPO | Admitting: Family Medicine

## 2017-01-29 ENCOUNTER — Encounter: Payer: Self-pay | Admitting: Family Medicine

## 2017-01-29 ENCOUNTER — Ambulatory Visit (INDEPENDENT_AMBULATORY_CARE_PROVIDER_SITE_OTHER): Payer: PPO

## 2017-01-29 VITALS — BP 140/78 | HR 67 | Ht 59.0 in | Wt 115.0 lb

## 2017-01-29 DIAGNOSIS — M5442 Lumbago with sciatica, left side: Principal | ICD-10-CM

## 2017-01-29 DIAGNOSIS — I700 Atherosclerosis of aorta: Secondary | ICD-10-CM | POA: Diagnosis not present

## 2017-01-29 DIAGNOSIS — G8929 Other chronic pain: Secondary | ICD-10-CM

## 2017-01-29 DIAGNOSIS — M4316 Spondylolisthesis, lumbar region: Secondary | ICD-10-CM | POA: Diagnosis not present

## 2017-01-29 DIAGNOSIS — M545 Low back pain: Secondary | ICD-10-CM | POA: Diagnosis not present

## 2017-01-29 DIAGNOSIS — E039 Hypothyroidism, unspecified: Secondary | ICD-10-CM

## 2017-01-29 DIAGNOSIS — I10 Essential (primary) hypertension: Secondary | ICD-10-CM

## 2017-01-29 LAB — COMPLETE METABOLIC PANEL WITH GFR
AG RATIO: 1.6 (calc) (ref 1.0–2.5)
ALT: 13 U/L (ref 6–29)
AST: 21 U/L (ref 10–35)
Albumin: 4.5 g/dL (ref 3.6–5.1)
Alkaline phosphatase (APISO): 76 U/L (ref 33–130)
BILIRUBIN TOTAL: 0.4 mg/dL (ref 0.2–1.2)
BUN/Creatinine Ratio: 26 (calc) — ABNORMAL HIGH (ref 6–22)
BUN: 29 mg/dL — AB (ref 7–25)
CHLORIDE: 106 mmol/L (ref 98–110)
CO2: 22 mmol/L (ref 20–32)
Calcium: 10.1 mg/dL (ref 8.6–10.4)
Creat: 1.11 mg/dL — ABNORMAL HIGH (ref 0.60–0.93)
GFR, Est African American: 57 mL/min/{1.73_m2} — ABNORMAL LOW (ref >=60)
GFR, Est Non African American: 49 mL/min/{1.73_m2} — ABNORMAL LOW (ref >=60)
GLUCOSE: 99 mg/dL (ref 65–99)
Globulin: 2.9 g/dL (calc) (ref 1.9–3.7)
POTASSIUM: 4.7 mmol/L (ref 3.5–5.3)
Sodium: 139 mmol/L (ref 135–146)
Total Protein: 7.4 g/dL (ref 6.1–8.1)

## 2017-01-29 LAB — TSH: TSH: 3.12 m[IU]/L (ref 0.40–4.50)

## 2017-01-29 MED ORDER — LOSARTAN POTASSIUM 100 MG PO TABS: 100.0000 mg | ORAL_TABLET | Freq: Every day | ORAL | 1 refills | Status: DC

## 2017-01-29 NOTE — Progress Notes (Signed)
Subjective:    CC: HTN 6 mo f/u   HPI:  Hypertension- Pt denies chest pain, SOB, dizziness, or heart palpitations.  Taking meds as directed w/o problems.  Denies medication side effects.    Chronic low back pain.  Last time addressed was about a year ago.  She was unable to do physical therapy because of the cost of the co-pay at that time but was taking naproxen, gabapentin and occasionally muscle relaxer.  Hypothyroid -taking medication regularly without any problems.  No recent changes.  has had episdoe of uscle cramping that occurred over the summer.  She was outside digging potatoes in the heat and was sweating and suddenly got severe cramping in her upper thighs and legs.  She was experiencing back pain at that time as well.  She says it was really quite severe.  And she has had soreness around the hips and upper thighs since then though she has not experienced any repeat cramping.   Past medical history, Surgical history, Family history not pertinant except as noted below, Social history, Allergies, and medications have been entered into the medical record, reviewed, and corrections made.   Review of Systems: No fevers, chills, night sweats, weight loss, chest pain, or shortness of breath.   Objective:    General: Well Developed, well nourished, and in no acute distress.  Neuro: Alert and oriented x3, extra-ocular muscles intact, sensation grossly intact.  HEENT: Normocephalic, atraumatic  Skin: Warm and dry, no rashes. Cardiac: Regular rate and rhythm, no murmurs rubs or gallops, no lower extremity edema.  Respiratory: Clear to auscultation bilaterally. Not using accessory muscles, speaking in full sentences. MSK: Normal lumbar flexion, extension, rotation right and left.  Hip, knee, ankle strength is 5 out of 5 bilaterally.  Patellar reflexes 1+ bilaterally.   Impression and Recommendations:    HTN - BPr borderline today.  Usually looks a little better. will monitor.  Due for  labs.    Hypothyroid -due to recheck level.  Chronic low back pain-we will go ahead and move forward with repeat x-ray today.  Plan will be to go ahead and get an MRI since she is having persistent bilateral buttock and hip pain in addition to tingling in her left foot.  I do feel like it has progressed and she is now getting some neurologic symptoms.  We discussed possibly increasing her gabapentin but she wants to think about it and she wants to work on maximizing her Tylenol before she does that.

## 2017-01-29 NOTE — Progress Notes (Signed)
Mri order place

## 2017-01-29 NOTE — Progress Notes (Signed)
HPI: FU SVT. Patient is status post ablation of [facility] at [facility] in September 2016. The patient had had SVT for years prior to her procedure. Since last seen, the patient denies any dyspnea on exertion, orthopnea, PND, pedal edema, palpitations, syncope or chest pain.   Current Outpatient Medications  Medication Sig Dispense Refill  . aspirin 81 MG tablet Take 81 mg by mouth daily.      . Calcium-Vitamin D (CALTRATE 600 PLUS-VIT D PO) Take by mouth.      . gabapentin (NEURONTIN) 300 MG capsule TAKE 1 CAPSULE BY MOUTH AT BEDTIME 90 capsule 1  . levothyroxine (SYNTHROID, LEVOTHROID) 75 MCG tablet TAKE ONE TABLET BY MOUTH ONCE DAILY 90 tablet 1  . losartan (COZAAR) 100 MG tablet Take 1 tablet (100 mg total) daily by mouth. 90 tablet 1  . Multiple Vitamins-Minerals (CENTRUM SILVER PO) Take by mouth.      . naproxen (NAPROSYN) 500 MG tablet TAKE ONE TABLET BY MOUTH TWICE DAILY WITH MEALS 180 tablet 1  . omeprazole (PRILOSEC) 40 MG capsule TAKE 1 BY MOUTH DAILY 90 capsule 1   No current facility-administered medications for this visit.      Past Medical History:  Diagnosis Date  . GERD (gastroesophageal reflux disease)   . Hiatal hernia   . Hyperlipidemia   . Hypertension   . Hypothyroid   . SVT (supraventricular tachycardia) (HCC)     Past Surgical History:  Procedure Laterality Date  . ABLATION    . CATARACT EXTRACTION      Social History   Socioeconomic History  . Marital status: Married    Spouse name: Not on file  . Number of children: 3  . Years of education: Not on file  . Highest education level: Not on file  Social Needs  . Financial resource strain: Not on file  . Food insecurity - worry: Not on file  . Food insecurity - inability: Not on file  . Transportation needs - medical: Not on file  . Transportation needs - non-medical: Not on file  Occupational History  . Not on file  Tobacco Use  . Smoking status: Former Games developermoker  .  Smokeless tobacco: Never Used  Substance and Sexual Activity  . Alcohol use: No  . Drug use: No  . Sexual activity: Not on file  Other Topics Concern  . Not on file  Social History Narrative  . Not on file    Family History  Problem Relation Age of Onset  . Heart disease Father        MI at age 73  . Hyperlipidemia Sister   . Hypertension Sister     ROS: back pain but no fevers or chills, productive cough, hemoptysis, dysphasia, odynophagia, melena, hematochezia, dysuria, hematuria, rash, seizure activity, orthopnea, PND, pedal edema, claudication. Remaining systems are negative.  Physical Exam: Well-developed well-nourished in no acute distress.  Skin is warm and dry.  HEENT is normal.  Neck is supple.  Chest is clear to auscultation with normal expansion.  Cardiovascular exam is regular rate and rhythm.  Abdominal exam nontender or distended. No masses palpated. Extremities show no edema. neuro grossly intact  ECG- normal sinus rhythm at a rate of 70. Right bundle branch block. personally reviewed  A/P  1 supraventricular tachycardia-patient has had no recurrent episodes of SVT since ablation. Continue beta blocker.  2 hyperlipidemia-patient is intolerant to statins. This is managed by primary care. Continue diet.  3 hypertension-blood  pressure is elevated. However she states it is controlled at home and it is always elevated at the doctor's office. Continue present medications and follow.  Olga MillersBrian Crenshaw, MD

## 2017-01-30 ENCOUNTER — Other Ambulatory Visit: Payer: Self-pay | Admitting: Family Medicine

## 2017-01-30 DIAGNOSIS — M5410 Radiculopathy, site unspecified: Secondary | ICD-10-CM

## 2017-02-07 ENCOUNTER — Ambulatory Visit: Payer: PPO | Admitting: Cardiology

## 2017-02-07 ENCOUNTER — Encounter: Payer: Self-pay | Admitting: Cardiology

## 2017-02-07 VITALS — BP 171/73 | HR 71 | Ht 59.0 in | Wt 116.8 lb

## 2017-02-07 DIAGNOSIS — I10 Essential (primary) hypertension: Secondary | ICD-10-CM

## 2017-02-07 DIAGNOSIS — E7800 Pure hypercholesterolemia, unspecified: Secondary | ICD-10-CM

## 2017-02-07 DIAGNOSIS — I471 Supraventricular tachycardia: Secondary | ICD-10-CM | POA: Diagnosis not present

## 2017-02-07 NOTE — Patient Instructions (Signed)
Your physician recommends that you schedule a follow-up appointment in: AS NEEDED  

## 2017-02-12 ENCOUNTER — Ambulatory Visit (INDEPENDENT_AMBULATORY_CARE_PROVIDER_SITE_OTHER): Payer: PPO

## 2017-02-12 DIAGNOSIS — M5116 Intervertebral disc disorders with radiculopathy, lumbar region: Secondary | ICD-10-CM | POA: Diagnosis not present

## 2017-02-12 DIAGNOSIS — M5442 Lumbago with sciatica, left side: Principal | ICD-10-CM

## 2017-02-12 DIAGNOSIS — M545 Low back pain: Secondary | ICD-10-CM | POA: Diagnosis not present

## 2017-02-12 DIAGNOSIS — G8929 Other chronic pain: Secondary | ICD-10-CM

## 2017-02-14 ENCOUNTER — Other Ambulatory Visit: Payer: Self-pay | Admitting: *Deleted

## 2017-02-14 DIAGNOSIS — M48061 Spinal stenosis, lumbar region without neurogenic claudication: Secondary | ICD-10-CM

## 2017-02-16 ENCOUNTER — Telehealth: Payer: Self-pay | Admitting: Family Medicine

## 2017-02-16 NOTE — Telephone Encounter (Signed)
Pt called. She wants to go ahead with referral to neurosurgeon.  She [facility]  [facility] and preferably after Christmas.

## 2017-02-16 NOTE — Telephone Encounter (Signed)
Please let her know that we are scheduling her with [facility] and I am pretty sure that they do actually do surgery in [facility].  They do not do neurosurgery at the local hospital [place] in [place].

## 2017-02-16 NOTE — Telephone Encounter (Signed)
Pt informed.Jessica Dawson  

## 2017-03-19 DIAGNOSIS — I10 Essential (primary) hypertension: Secondary | ICD-10-CM | POA: Diagnosis not present

## 2017-03-19 DIAGNOSIS — M4807 Spinal stenosis, lumbosacral region: Secondary | ICD-10-CM | POA: Diagnosis not present

## 2017-03-19 DIAGNOSIS — M4316 Spondylolisthesis, lumbar region: Secondary | ICD-10-CM | POA: Diagnosis not present

## 2017-04-06 ENCOUNTER — Other Ambulatory Visit: Payer: Self-pay | Admitting: *Deleted

## 2017-04-06 MED ORDER — LEVOTHYROXINE SODIUM 75 MCG PO TABS: 75.0000 ug | ORAL_TABLET | Freq: Every day | ORAL | 1 refills | Status: DC

## 2017-06-18 ENCOUNTER — Ambulatory Visit (INDEPENDENT_AMBULATORY_CARE_PROVIDER_SITE_OTHER): Payer: PPO | Admitting: Family Medicine

## 2017-06-18 ENCOUNTER — Encounter: Payer: Self-pay | Admitting: Family Medicine

## 2017-06-18 VITALS — BP 130/69 | HR 67 | Ht 59.0 in | Wt 113.0 lb

## 2017-06-18 DIAGNOSIS — R0789 Other chest pain: Secondary | ICD-10-CM | POA: Diagnosis not present

## 2017-06-18 DIAGNOSIS — I10 Essential (primary) hypertension: Secondary | ICD-10-CM

## 2017-06-18 DIAGNOSIS — S39012D Strain of muscle, fascia and tendon of lower back, subsequent encounter: Secondary | ICD-10-CM | POA: Diagnosis not present

## 2017-06-18 DIAGNOSIS — K219 Gastro-esophageal reflux disease without esophagitis: Secondary | ICD-10-CM | POA: Diagnosis not present

## 2017-06-18 LAB — BASIC METABOLIC PANEL WITH GFR
BUN / CREAT RATIO: 20 (calc) (ref 6–22)
BUN: 19 mg/dL (ref 7–25)
CHLORIDE: 105 mmol/L (ref 98–110)
CO2: 26 mmol/L (ref 20–32)
Calcium: 9.9 mg/dL (ref 8.6–10.4)
Creat: 0.96 mg/dL — ABNORMAL HIGH (ref 0.60–0.93)
GFR, Est African American: 68 mL/min/{1.73_m2} (ref >=60)
GFR, Est Non African American: 58 mL/min/{1.73_m2} — ABNORMAL LOW (ref >=60)
GLUCOSE: 95 mg/dL (ref 65–99)
Potassium: 4.4 mmol/L (ref 3.5–5.3)
SODIUM: 140 mmol/L (ref 135–146)

## 2017-06-18 MED ORDER — TELMISARTAN 80 MG PO TABS: 80.0000 mg | ORAL_TABLET | Freq: Every day | ORAL | 1 refills | Status: DC

## 2017-06-18 MED ORDER — OMEPRAZOLE 40 MG PO CPDR: DELAYED_RELEASE_CAPSULE | ORAL | 1 refills | Status: DC

## 2017-06-18 MED ORDER — GABAPENTIN 300 MG PO CAPS: 300.0000 mg | ORAL_CAPSULE | Freq: Every day | ORAL | 1 refills | Status: DC

## 2017-06-18 NOTE — Progress Notes (Signed)
Subjective:    CC: hurt her back.    HPI:  Hypertension- Pt denies chest pain, SOB, dizziness, or heart palpitations.  Taking meds as directed w/o problems.  Denies medication side effects.  Will need her losartan changed to a different ARB.  Chronic low back pain - she consulted with the neurosurgery and they recommended surgery but she is not sure if she wants to do it. She is using Rice heating pad, gabapentin,  And naproxen.  She is considering a second opinion.    She also was standing on wheel of truck leaning over to get something and slipped and scraped her upper chest on the edge of the truck bed. She has been using heat pack and Tylenol.    GERD-she is requesting a refill on her omeprazole which she uses daily.  Past medical history, Surgical history, Family history not pertinant except as noted below, Social history, Allergies, and medications have been entered into the medical record, reviewed, and corrections made.   Review of Systems: No fevers, chills, night sweats, weight loss, chest pain, or shortness of breath.   Objective:    General: Well Developed, well nourished, and in no acute distress.  Neuro: Alert and oriented x3, extra-ocular muscles intact, sensation grossly intact.  HEENT: Normocephalic, atraumatic  Skin: Warm and dry, no rashes. Cardiac: Regular rate and rhythm, no murmurs rubs or gallops, no lower extremity edema.  Respiratory: Clear to auscultation bilaterally. Not using accessory muscles, speaking in full sentences. MSK: Over her upper sternum she has an area that is swollen and mildly tender.  Just a little bit of erythema but actually no significant bruising.   Impression and Recommendations:    HTN - Well controlled. Continue current regimen.  Losartan changed to generic Micardis.  New prescription sent to pharmacy.  Follow up in  6 months.   Chronic low back pain  -be happy to refer her for a second opinion if she would like.  I actually think  this might be a good idea before having a major surgery.  She is worried about having it done at her age but overall she is actually very healthy and will probably do well ultimately that is up to her.  She does have daily pain but right now feels like it is livable with her gabapentin and Tylenol.  Sternum pain-contusion and swelling of the sternum.  It is possible that she may have fractured it may also just be soft tissue and a bone bruise.  Offered to get x-ray but sometimes it does not make a big difference.  She says is not nearly as sore as it was. She will continue the Tylenol and heat and let me know if it is not continuing to heal well.    GERD - refilled her omeprzole.

## 2017-07-11 ENCOUNTER — Other Ambulatory Visit: Payer: Self-pay | Admitting: Family Medicine

## 2017-07-30 ENCOUNTER — Ambulatory Visit: Payer: PPO | Admitting: Family Medicine

## 2017-10-01 ENCOUNTER — Encounter: Payer: Self-pay | Admitting: Family Medicine

## 2017-10-01 ENCOUNTER — Ambulatory Visit (INDEPENDENT_AMBULATORY_CARE_PROVIDER_SITE_OTHER): Payer: PPO | Admitting: Family Medicine

## 2017-10-01 VITALS — BP 140/72 | HR 78 | Temp 98.3°F | Ht 59.0 in | Wt 113.0 lb

## 2017-10-01 DIAGNOSIS — J0100 Acute maxillary sinusitis, unspecified: Secondary | ICD-10-CM

## 2017-10-01 MED ORDER — AMOXICILLIN-POT CLAVULANATE 875-125 MG PO TABS: 1.0000 | ORAL_TABLET | Freq: Two times a day (BID) | ORAL | 0 refills | Status: DC

## 2017-10-01 NOTE — Progress Notes (Signed)
   Subjective:    Patient ID: Jessica Dawson, female    DOB: 12-21-1945, 72 y.o.   MRN: [id number]  HPI 72 yo female c/ of 6 days of nasal congestion.  Pressure in her face and teeth with nasal drainage.  Fever, chills, or sweats.  No sore throat.  She is had pain behind both of her eyeballs and and radiating into her ears.  Some slight cough but no significant cough or chest congestion.  She is been taking Tylenol flu and says she is gone through an entire box without any significant relief in symptoms.     Review of Systems     Objective:   Physical Exam  Constitutional: She is oriented to person, place, and time. She appears well-developed and well-nourished.  HENT:  Head: Normocephalic and atraumatic.  Right Ear: External ear normal.  Left Ear: External ear normal.  Nose: Nose normal.  Mouth/Throat: Oropharynx is clear and moist.  TMs and canals are clear.   Eyes: Pupils are equal, round, and reactive to light. Conjunctivae and EOM are normal.  Neck: Neck supple. No thyromegaly present.  Cardiovascular: Normal rate, regular rhythm and normal heart sounds.  Pulmonary/Chest: Effort normal and breath sounds normal. She has no wheezes.  Lymphadenopathy:    She has no cervical adenopathy.  Neurological: She is alert and oriented to person, place, and time.  Skin: Skin is warm and dry.  Psychiatric: She has a normal mood and affect.       Assessment & Plan:  Acute sinusitis-treat with Augmentin.  If not significantly better in 1 week then please give us a call back.  Okay to continue over-the-counter symptom medic treatment.

## 2017-10-01 NOTE — Patient Instructions (Signed)

## 2017-10-04 ENCOUNTER — Telehealth: Payer: Self-pay | Admitting: Family Medicine

## 2017-10-04 MED ORDER — AZITHROMYCIN 250 MG PO TABS: ORAL_TABLET | ORAL | 0 refills | Status: AC

## 2017-10-04 NOTE — Telephone Encounter (Signed)
Pt called clinic stating she has been on the antibiotic since her OV with PCP for sinus infection, now having diarrhea. Questions if she should continue or if it should be changed. Routing.

## 2017-10-04 NOTE — Telephone Encounter (Signed)
Okay to stop the amoxicillin and I sent over new prescription for azithromycin

## 2017-10-05 NOTE — Telephone Encounter (Signed)
Pt advised.

## 2017-11-07 ENCOUNTER — Other Ambulatory Visit: Payer: Self-pay | Admitting: Family Medicine

## 2017-12-17 ENCOUNTER — Ambulatory Visit (INDEPENDENT_AMBULATORY_CARE_PROVIDER_SITE_OTHER): Payer: PPO | Admitting: Family Medicine

## 2017-12-17 ENCOUNTER — Encounter: Payer: Self-pay | Admitting: Family Medicine

## 2017-12-17 VITALS — BP 156/62 | HR 70 | Ht 59.0 in | Wt 109.0 lb

## 2017-12-17 DIAGNOSIS — E039 Hypothyroidism, unspecified: Secondary | ICD-10-CM | POA: Diagnosis not present

## 2017-12-17 DIAGNOSIS — G8929 Other chronic pain: Secondary | ICD-10-CM

## 2017-12-17 DIAGNOSIS — I10 Essential (primary) hypertension: Secondary | ICD-10-CM | POA: Diagnosis not present

## 2017-12-17 DIAGNOSIS — M545 Low back pain: Secondary | ICD-10-CM

## 2017-12-17 DIAGNOSIS — Z23 Encounter for immunization: Secondary | ICD-10-CM | POA: Diagnosis not present

## 2017-12-17 NOTE — Progress Notes (Signed)
Subjective:    CC:   HPI:  Hypertension- Pt denies chest pain, SOB, dizziness, or heart palpitations.  Taking meds as directed w/o problems.  Denies medication side effects.    Follow-up chronic low back pain-she actually saw Dr. Franky Macho, neurosurgery in January and he had recommended surgery.  He said that she could consider injections but did not really feel like they would be that helpful based on what is going on on her back.  She is just very hesitant to have surgery she is worried about it.  But she also knows that if she is going to do it she needs to do it now instead of waiting until she is too old to actually have the surgery.  She is currently using her gabapentin and Tylenol for pain control.  Hypothyroidism - Taking medication regularly in the AM away from food and vitamins, etc. No recent change to skin, hair, or energy levels.   Past medical history, Surgical history, Family history not pertinant except as noted below, Social history, Allergies, and medications have been entered into the medical record, reviewed, and corrections made.   Review of Systems: No fevers, chills, night sweats, weight loss, chest pain, or shortness of breath.   Objective:    General: Well Developed, well nourished, and in no acute distress.  Neuro: Alert and oriented x3, extra-ocular muscles intact, sensation grossly intact.  HEENT: Normocephalic, atraumatic  Skin: Warm and dry, no rashes. Cardiac: Regular rate and rhythm, no murmurs rubs or gallops, no lower extremity edema.  Respiratory: Clear to auscultation bilaterally. Not using accessory muscles, speaking in full sentences.   Impression and Recommendations:    HTN - Uncontrolled. Continue current regimen. Follow up in  2 weeks for nurse visit to recheck pressure    Chronic back pain, low-last MRI was in December 2018 showing lumbar scoliosis with multilevel disc and facet degeneration as well as a 7 mm anterior listhesis of L4-5 with  moderate to severe spinal stenosis.  She would like a second opinion.  Will refer to orthopedist.  Hypothyroidism - due to recheck TSH.

## 2017-12-18 LAB — TSH: TSH: 0.72 mIU/L (ref 0.40–4.50)

## 2017-12-18 LAB — COMPLETE METABOLIC PANEL WITH GFR
AG Ratio: 1.4 (calc) (ref 1.0–2.5)
ALKALINE PHOSPHATASE (APISO): 70 U/L (ref 33–130)
ALT: 13 U/L (ref 6–29)
AST: 21 U/L (ref 10–35)
Albumin: 4.4 g/dL (ref 3.6–5.1)
BUN/Creatinine Ratio: 32 (calc) — ABNORMAL HIGH (ref 6–22)
BUN: 38 mg/dL — ABNORMAL HIGH (ref 7–25)
CALCIUM: 9.8 mg/dL (ref 8.6–10.4)
CO2: 26 mmol/L (ref 20–32)
CREATININE: 1.2 mg/dL — AB (ref 0.60–0.93)
Chloride: 104 mmol/L (ref 98–110)
GFR, EST NON AFRICAN AMERICAN: 44 mL/min/{1.73_m2} — AB (ref >=60)
GFR, Est African American: 52 mL/min/{1.73_m2} — ABNORMAL LOW (ref >=60)
GLOBULIN: 3.1 g/dL (ref 1.9–3.7)
GLUCOSE: 91 mg/dL (ref 65–99)
Potassium: 5.4 mmol/L — ABNORMAL HIGH (ref 3.5–5.3)
SODIUM: 136 mmol/L (ref 135–146)
Total Bilirubin: 0.5 mg/dL (ref 0.2–1.2)
Total Protein: 7.5 g/dL (ref 6.1–8.1)

## 2017-12-18 LAB — LIPID PANEL
CHOLESTEROL: 316 mg/dL — AB (ref <200)
HDL: 44 mg/dL — AB (ref >50)
LDL Cholesterol (Calc): 234 mg/dL (calc) — ABNORMAL HIGH
Non-HDL Cholesterol (Calc): 272 mg/dL (calc) — ABNORMAL HIGH (ref <130)
TRIGLYCERIDES: 197 mg/dL — AB (ref <150)
Total CHOL/HDL Ratio: 7.2 (calc) — ABNORMAL HIGH (ref <5.0)

## 2017-12-19 ENCOUNTER — Other Ambulatory Visit: Payer: Self-pay | Admitting: *Deleted

## 2017-12-19 DIAGNOSIS — R7989 Other specified abnormal findings of blood chemistry: Secondary | ICD-10-CM

## 2017-12-31 ENCOUNTER — Ambulatory Visit (INDEPENDENT_AMBULATORY_CARE_PROVIDER_SITE_OTHER): Payer: PPO | Admitting: Physician Assistant

## 2017-12-31 ENCOUNTER — Other Ambulatory Visit: Payer: Self-pay

## 2017-12-31 VITALS — BP 138/66 | HR 71

## 2017-12-31 DIAGNOSIS — I10 Essential (primary) hypertension: Secondary | ICD-10-CM | POA: Diagnosis not present

## 2017-12-31 DIAGNOSIS — R7989 Other specified abnormal findings of blood chemistry: Secondary | ICD-10-CM | POA: Diagnosis not present

## 2017-12-31 LAB — BASIC METABOLIC PANEL
BUN / CREAT RATIO: 31 (calc) — AB (ref 6–22)
BUN: 37 mg/dL — AB (ref 7–25)
CO2: 25 mmol/L (ref 20–32)
Calcium: 9.8 mg/dL (ref 8.6–10.4)
Chloride: 105 mmol/L (ref 98–110)
Creat: 1.18 mg/dL — ABNORMAL HIGH (ref 0.60–0.93)
GLUCOSE: 93 mg/dL (ref 65–99)
Potassium: 5 mmol/L (ref 3.5–5.3)
SODIUM: 138 mmol/L (ref 135–146)

## 2017-12-31 MED ORDER — TELMISARTAN 80 MG PO TABS: 80.0000 mg | ORAL_TABLET | Freq: Every day | ORAL | 1 refills | Status: DC

## 2017-12-31 NOTE — Progress Notes (Signed)
Pt came into clinic today for BP check. At last OV, her BP was above goal. Pt reports taking Micardis 80mg  daily, half tab in AM and half tab in PM. Pt states she does this to make sure the Rx "stays in her system all day." Pt denies any chest pain, SOB, dizziness, or other complications. Pt's BP was at goal today. Will route to covering Provider. She is due for lab recheck, her Potassium was elevated on last check, lab slip provided. No further questions.   Continue on same dose. Ok to refill if needed. Will call with results of potassium lab. Tandy Gaw PA-C

## 2017-12-31 NOTE — Progress Notes (Signed)
Pt advised.

## 2018-01-06 ENCOUNTER — Encounter: Payer: Self-pay | Admitting: Family Medicine

## 2018-01-06 DIAGNOSIS — N1831 Chronic kidney disease, stage 3a: Secondary | ICD-10-CM | POA: Insufficient documentation

## 2018-01-06 DIAGNOSIS — N183 Chronic kidney disease, stage 3 unspecified: Secondary | ICD-10-CM | POA: Insufficient documentation

## 2018-01-09 ENCOUNTER — Other Ambulatory Visit: Payer: Self-pay | Admitting: Family Medicine

## 2018-01-10 ENCOUNTER — Telehealth: Payer: Self-pay | Admitting: Family Medicine

## 2018-01-10 DIAGNOSIS — G8929 Other chronic pain: Secondary | ICD-10-CM

## 2018-01-10 DIAGNOSIS — M545 Low back pain: Secondary | ICD-10-CM

## 2018-01-10 DIAGNOSIS — M5410 Radiculopathy, site unspecified: Secondary | ICD-10-CM

## 2018-01-10 DIAGNOSIS — M48061 Spinal stenosis, lumbar region without neurogenic claudication: Secondary | ICD-10-CM

## 2018-01-10 DIAGNOSIS — S39012D Strain of muscle, fascia and tendon of lower back, subsequent encounter: Secondary | ICD-10-CM

## 2018-01-10 DIAGNOSIS — M5442 Lumbago with sciatica, left side: Secondary | ICD-10-CM

## 2018-01-10 NOTE — Telephone Encounter (Signed)
Dr. Joylene Igo called and would like a referral to see Dr. Kathy Breach at [facility] and spine for her back.   Thank you Arline Asp

## 2018-01-10 NOTE — Telephone Encounter (Signed)
Ok to place referral.

## 2018-01-10 NOTE — Telephone Encounter (Signed)
Referral placed.

## 2018-01-28 ENCOUNTER — Ambulatory Visit (INDEPENDENT_AMBULATORY_CARE_PROVIDER_SITE_OTHER): Payer: Self-pay | Admitting: Specialist

## 2018-02-18 ENCOUNTER — Other Ambulatory Visit: Payer: Self-pay | Admitting: Family Medicine

## 2018-03-18 DIAGNOSIS — M5416 Radiculopathy, lumbar region: Secondary | ICD-10-CM | POA: Diagnosis not present

## 2018-03-18 DIAGNOSIS — M48062 Spinal stenosis, lumbar region with neurogenic claudication: Secondary | ICD-10-CM | POA: Diagnosis not present

## 2018-03-18 DIAGNOSIS — M4316 Spondylolisthesis, lumbar region: Secondary | ICD-10-CM | POA: Diagnosis not present

## 2018-03-18 DIAGNOSIS — G8929 Other chronic pain: Secondary | ICD-10-CM | POA: Diagnosis not present

## 2018-03-18 DIAGNOSIS — M47896 Other spondylosis, lumbar region: Secondary | ICD-10-CM | POA: Diagnosis not present

## 2018-03-18 DIAGNOSIS — M5442 Lumbago with sciatica, left side: Secondary | ICD-10-CM | POA: Diagnosis not present

## 2018-03-18 DIAGNOSIS — M5136 Other intervertebral disc degeneration, lumbar region: Secondary | ICD-10-CM | POA: Diagnosis not present

## 2018-04-02 DIAGNOSIS — M4726 Other spondylosis with radiculopathy, lumbar region: Secondary | ICD-10-CM | POA: Diagnosis not present

## 2018-04-02 DIAGNOSIS — M5116 Intervertebral disc disorders with radiculopathy, lumbar region: Secondary | ICD-10-CM | POA: Diagnosis not present

## 2018-04-02 DIAGNOSIS — G8929 Other chronic pain: Secondary | ICD-10-CM | POA: Diagnosis not present

## 2018-04-02 DIAGNOSIS — M5416 Radiculopathy, lumbar region: Secondary | ICD-10-CM | POA: Diagnosis not present

## 2018-04-02 DIAGNOSIS — M5442 Lumbago with sciatica, left side: Secondary | ICD-10-CM | POA: Diagnosis not present

## 2018-04-02 DIAGNOSIS — M4727 Other spondylosis with radiculopathy, lumbosacral region: Secondary | ICD-10-CM | POA: Diagnosis not present

## 2018-04-02 DIAGNOSIS — M5117 Intervertebral disc disorders with radiculopathy, lumbosacral region: Secondary | ICD-10-CM | POA: Diagnosis not present

## 2018-04-29 DIAGNOSIS — M5416 Radiculopathy, lumbar region: Secondary | ICD-10-CM | POA: Diagnosis not present

## 2018-04-29 DIAGNOSIS — M48062 Spinal stenosis, lumbar region with neurogenic claudication: Secondary | ICD-10-CM | POA: Diagnosis not present

## 2018-04-29 DIAGNOSIS — M47816 Spondylosis without myelopathy or radiculopathy, lumbar region: Secondary | ICD-10-CM | POA: Diagnosis not present

## 2018-04-29 DIAGNOSIS — G8929 Other chronic pain: Secondary | ICD-10-CM | POA: Diagnosis not present

## 2018-04-29 DIAGNOSIS — M4316 Spondylolisthesis, lumbar region: Secondary | ICD-10-CM | POA: Diagnosis not present

## 2018-04-29 DIAGNOSIS — M5442 Lumbago with sciatica, left side: Secondary | ICD-10-CM | POA: Diagnosis not present

## 2018-04-29 DIAGNOSIS — M5136 Other intervertebral disc degeneration, lumbar region: Secondary | ICD-10-CM | POA: Diagnosis not present

## 2018-05-06 DIAGNOSIS — Z0100 Encounter for examination of eyes and vision without abnormal findings: Secondary | ICD-10-CM | POA: Diagnosis not present

## 2018-05-06 DIAGNOSIS — H524 Presbyopia: Secondary | ICD-10-CM | POA: Diagnosis not present

## 2018-05-06 DIAGNOSIS — H47333 Pseudopapilledema of optic disc, bilateral: Secondary | ICD-10-CM | POA: Diagnosis not present

## 2018-05-09 DIAGNOSIS — Z01818 Encounter for other preprocedural examination: Secondary | ICD-10-CM | POA: Diagnosis not present

## 2018-05-17 DIAGNOSIS — Z888 Allergy status to other drugs, medicaments and biological substances status: Secondary | ICD-10-CM | POA: Diagnosis not present

## 2018-05-17 DIAGNOSIS — K219 Gastro-esophageal reflux disease without esophagitis: Secondary | ICD-10-CM | POA: Diagnosis not present

## 2018-05-17 DIAGNOSIS — E039 Hypothyroidism, unspecified: Secondary | ICD-10-CM | POA: Diagnosis not present

## 2018-05-17 DIAGNOSIS — M4726 Other spondylosis with radiculopathy, lumbar region: Secondary | ICD-10-CM | POA: Diagnosis not present

## 2018-05-17 DIAGNOSIS — N189 Chronic kidney disease, unspecified: Secondary | ICD-10-CM | POA: Diagnosis not present

## 2018-05-17 DIAGNOSIS — I129 Hypertensive chronic kidney disease with stage 1 through stage 4 chronic kidney disease, or unspecified chronic kidney disease: Secondary | ICD-10-CM | POA: Diagnosis not present

## 2018-05-17 DIAGNOSIS — Z79899 Other long term (current) drug therapy: Secondary | ICD-10-CM | POA: Diagnosis not present

## 2018-05-17 DIAGNOSIS — Z791 Long term (current) use of non-steroidal anti-inflammatories (NSAID): Secondary | ICD-10-CM | POA: Diagnosis not present

## 2018-05-17 DIAGNOSIS — Z87891 Personal history of nicotine dependence: Secondary | ICD-10-CM | POA: Diagnosis not present

## 2018-05-17 DIAGNOSIS — M48062 Spinal stenosis, lumbar region with neurogenic claudication: Secondary | ICD-10-CM | POA: Insufficient documentation

## 2018-05-17 DIAGNOSIS — Z7982 Long term (current) use of aspirin: Secondary | ICD-10-CM | POA: Diagnosis not present

## 2018-05-17 DIAGNOSIS — M5416 Radiculopathy, lumbar region: Secondary | ICD-10-CM | POA: Diagnosis not present

## 2018-05-24 ENCOUNTER — Other Ambulatory Visit: Payer: Self-pay | Admitting: *Deleted

## 2018-05-24 MED ORDER — CLOTRIMAZOLE 10 MG MT TROC: 10.0000 mg | Freq: Every day | OROMUCOSAL | 2 refills | Status: DC

## 2018-06-17 ENCOUNTER — Encounter: Payer: Self-pay | Admitting: Family Medicine

## 2018-06-17 ENCOUNTER — Ambulatory Visit (INDEPENDENT_AMBULATORY_CARE_PROVIDER_SITE_OTHER): Payer: Medicare HMO | Admitting: Family Medicine

## 2018-06-17 ENCOUNTER — Other Ambulatory Visit: Payer: Self-pay

## 2018-06-17 ENCOUNTER — Ambulatory Visit: Payer: PPO

## 2018-06-17 VITALS — BP 107/63 | HR 64 | Temp 95.8°F | Wt 111.0 lb

## 2018-06-17 DIAGNOSIS — N183 Chronic kidney disease, stage 3 unspecified: Secondary | ICD-10-CM

## 2018-06-17 DIAGNOSIS — I10 Essential (primary) hypertension: Secondary | ICD-10-CM

## 2018-06-17 DIAGNOSIS — Z9889 Other specified postprocedural states: Secondary | ICD-10-CM

## 2018-06-17 DIAGNOSIS — E039 Hypothyroidism, unspecified: Secondary | ICD-10-CM | POA: Diagnosis not present

## 2018-06-17 DIAGNOSIS — I129 Hypertensive chronic kidney disease with stage 1 through stage 4 chronic kidney disease, or unspecified chronic kidney disease: Secondary | ICD-10-CM

## 2018-06-17 MED ORDER — GABAPENTIN 300 MG PO CAPS: 300.0000 mg | ORAL_CAPSULE | Freq: Every day | ORAL | 3 refills | Status: DC

## 2018-06-17 MED ORDER — LEVOTHYROXINE SODIUM 75 MCG PO TABS: 75.0000 ug | ORAL_TABLET | Freq: Every day | ORAL | 0 refills | Status: DC

## 2018-06-17 NOTE — Progress Notes (Signed)
Virtual Visit via Telephone Note  I connected with Jessica Dawson on 06/17/18 at  9:10 AM EDT by telephone and verified that I am speaking with the correct person using two identifiers.   I discussed the limitations, risks, security and privacy concerns of performing an evaluation and management service by telephone and the availability of in person appointments. I also discussed with the patient that there may be a patient responsible charge related to this service. The patient expressed understanding and agreed to proceed.  Patient was at home for the visit and provider was in the office at [facility].  Subjective:    CC: BP and kidney - 6 mo f/u  HPI:  She just had left lumbar laminectomy 4 weeks ago and she is doing well.  She has been walking for 30 min daily.  Started getting some hip pain.  They are going to address that at follow-up.   Hypertension- Pt denies chest pain, SOB, dizziness, or heart palpitations.  Taking meds as directed w/o problems.  Denies medication side effects.    Hypothyroidism - Taking medication regularly in the AM away from food and vitamins, etc. No recent change to skin, hair, or energy levels.  F/U CKD 3 -he did have some questions about the chronic kidney disease.  She was not aware that she had this diagnosis and wanted some further information.  Past medical history, Surgical history, Family history not pertinant except as noted below, Social history, Allergies, and medications have been entered into the medical record, reviewed, and corrections made.   Review of Systems: No fevers, chills, night sweats, weight loss, chest pain, or shortness of breath.   Objective:    General: Speaking clearly in complete sentences without any shortness of breath.  Alert and oriented x3.  Normal judgment. No apparent acute distress.   Impression and Recommendations:   HTN - Well controlled.  He has always done a great job in controlling her  blood pressure.  Continue current regimen. Follow up in  6 months.   Hypothyroidism- doing well. Asymptomatic.  Go ahead and order labs for her to go sometime this summer.  CKD 3 -reviewed with her that her creatinine has been elevated around 1.0 for at least 3 years since 2017.  It is not new but and it has been stable overall.  Did encourage to encourage her to make sure she is drinking plenty of water as her BUN is often elevated as well.  Just reminded her to use NSAIDs very sparingly.  And make sure to continue to work on controlling blood pressure really well.  Rarely uses her Naproxen.  Ordered placed.   Status post lumbar surgery-she is actually doing really well on her own she is not getting any home PT because of the pandemic but has been trying to walk for 15 to 30 minutes/day which is fantastic.   I discussed the assessment and treatment plan with the patient. The patient was provided an opportunity to ask questions and all were answered. The patient agreed with the plan and demonstrated an understanding of the instructions.   The patient was advised to call back or seek an in-person evaluation if the symptoms worsen or if the condition fails to improve as anticipated.   [name], MD

## 2018-06-17 NOTE — Progress Notes (Signed)
Pt wanted to ask Dr. Linford Arnold about her kidneys. She was told that she had CKD for about 2 yrs and she had no idea wanted to ask her about this.   Otherwise she is healing well from her recent surgery.Marland KitchenMarland KitchenHeath Gold, CMA

## 2018-07-03 ENCOUNTER — Other Ambulatory Visit: Payer: Self-pay | Admitting: Family Medicine

## 2018-07-22 ENCOUNTER — Ambulatory Visit: Payer: PPO

## 2018-07-30 DIAGNOSIS — Z4789 Encounter for other orthopedic aftercare: Secondary | ICD-10-CM | POA: Diagnosis not present

## 2018-07-30 DIAGNOSIS — M6281 Muscle weakness (generalized): Secondary | ICD-10-CM | POA: Diagnosis not present

## 2018-07-30 DIAGNOSIS — R6889 Other general symptoms and signs: Secondary | ICD-10-CM | POA: Diagnosis not present

## 2018-07-30 DIAGNOSIS — M545 Low back pain: Secondary | ICD-10-CM | POA: Diagnosis not present

## 2018-08-19 DIAGNOSIS — N189 Chronic kidney disease, unspecified: Secondary | ICD-10-CM | POA: Diagnosis not present

## 2018-08-19 DIAGNOSIS — R29898 Other symptoms and signs involving the musculoskeletal system: Secondary | ICD-10-CM | POA: Diagnosis not present

## 2018-08-19 DIAGNOSIS — M545 Low back pain: Secondary | ICD-10-CM | POA: Diagnosis not present

## 2018-08-19 DIAGNOSIS — I129 Hypertensive chronic kidney disease with stage 1 through stage 4 chronic kidney disease, or unspecified chronic kidney disease: Secondary | ICD-10-CM | POA: Diagnosis not present

## 2018-08-19 DIAGNOSIS — M6281 Muscle weakness (generalized): Secondary | ICD-10-CM | POA: Diagnosis not present

## 2018-08-19 DIAGNOSIS — Z4789 Encounter for other orthopedic aftercare: Secondary | ICD-10-CM | POA: Diagnosis not present

## 2018-09-02 ENCOUNTER — Ambulatory Visit (INDEPENDENT_AMBULATORY_CARE_PROVIDER_SITE_OTHER): Payer: Medicare HMO | Admitting: *Deleted

## 2018-09-02 VITALS — BP 137/70 | HR 68 | Ht <= 58 in | Wt 113.0 lb

## 2018-09-02 DIAGNOSIS — I10 Essential (primary) hypertension: Secondary | ICD-10-CM | POA: Diagnosis not present

## 2018-09-02 DIAGNOSIS — Z0000 Encounter for general adult medical examination without abnormal findings: Secondary | ICD-10-CM

## 2018-09-02 DIAGNOSIS — G8929 Other chronic pain: Secondary | ICD-10-CM | POA: Diagnosis not present

## 2018-09-02 DIAGNOSIS — M4316 Spondylolisthesis, lumbar region: Secondary | ICD-10-CM | POA: Diagnosis not present

## 2018-09-02 DIAGNOSIS — E039 Hypothyroidism, unspecified: Secondary | ICD-10-CM | POA: Diagnosis not present

## 2018-09-02 DIAGNOSIS — M48062 Spinal stenosis, lumbar region with neurogenic claudication: Secondary | ICD-10-CM | POA: Diagnosis not present

## 2018-09-02 DIAGNOSIS — N183 Chronic kidney disease, stage 3 (moderate): Secondary | ICD-10-CM | POA: Diagnosis not present

## 2018-09-02 DIAGNOSIS — Z09 Encounter for follow-up examination after completed treatment for conditions other than malignant neoplasm: Secondary | ICD-10-CM | POA: Diagnosis not present

## 2018-09-02 DIAGNOSIS — M5442 Lumbago with sciatica, left side: Secondary | ICD-10-CM | POA: Diagnosis not present

## 2018-09-02 DIAGNOSIS — M5441 Lumbago with sciatica, right side: Secondary | ICD-10-CM | POA: Diagnosis not present

## 2018-09-02 DIAGNOSIS — M5416 Radiculopathy, lumbar region: Secondary | ICD-10-CM | POA: Diagnosis not present

## 2018-09-02 DIAGNOSIS — M5136 Other intervertebral disc degeneration, lumbar region: Secondary | ICD-10-CM | POA: Diagnosis not present

## 2018-09-02 NOTE — Progress Notes (Signed)
Subjective:   Jessica Dawson is a 73 y.o. female who presents for an Initial Medicare Annual Wellness Visit.  Review of Systems    No ROS.  Medicare Wellness Virtual Visit.  Visual/audio telehealth visit, UTA vital signs.   See social history for additional risk factors.     Cardiac Risk Factors include: advanced age (>[id number], 51>65 women);hypertension;dyslipidemia Sleep patterns: Getting 6 hours of sleep a night. Wakes up 1 time a night to void. Feels refreshed upon wakening. Home Safety/Smoke Alarms: Feels safe in home. Smoke alarms in place.  Living environment; Lives with husband in 1 story home and steps have handrails on them. Shower is a walk in shower with seat in shower.  Seat Belt Safety/Bike Helmet: Wears seat belt.   Female:   Pap- Aged out      Mammo- Aged out      Dexa scan- offered and patient would like to wait on doing it.       CCS- UTD      Objective:    Today's Vitals   09/02/18 0905  BP: 137/70  Pulse: 68  SpO2: 100%  Weight: 113 lb (51.3 kg)  Height: 4\' 10"  (1.473 m)  PainSc: 4    Body mass index is 23.62 kg/m.  Advanced Directives 09/02/2018  Does Patient Have a Medical Advance Directive? No  Would patient like information on creating a medical advance directive? Yes (MAU/Ambulatory/Procedural Areas - Information given)    Current Medications (verified) Outpatient Encounter Medications as of 09/02/2018  Medication Sig  . aspirin 81 MG tablet Take 81 mg by mouth daily.    . Calcium-Vitamin D (CALTRATE 600 PLUS-VIT D PO) Take by mouth.    . clotrimazole (MYCELEX) 10 MG troche Take 1 tablet (10 mg total) by mouth 5 (five) times daily.  Marland Kitchen. gabapentin (NEURONTIN) 300 MG capsule Take 1 capsule (300 mg total) by mouth at bedtime.  Marland Kitchen. levothyroxine (SYNTHROID, LEVOTHROID) 75 MCG tablet Take 1 tablet (75 mcg total) by mouth daily.  . Multiple Vitamins-Minerals (CENTRUM SILVER PO) Take by mouth.    Marland Kitchen. omeprazole (PRILOSEC) 40 MG capsule TAKE 1 BY MOUTH  DAILY  . telmisartan (MICARDIS) 80 MG tablet Take 1 tablet by mouth once daily  . naproxen (NAPROSYN) 500 MG tablet TAKE 1 TABLET BY MOUTH TWICE DAILY WITH MEALS (Patient not taking: Reported on 09/02/2018)   No facility-administered encounter medications on file as of 09/02/2018.     Allergies (verified) Crestor [rosuvastatin], Lipitor [atorvastatin], Livalo [pitavastatin], and Pravastatin sodium   History: Past Medical History:  Diagnosis Date  . GERD (gastroesophageal reflux disease)   . Hiatal hernia   . Hyperlipidemia   . Hypertension   . Hypothyroid   . SVT (supraventricular tachycardia) (HCC)    Past Surgical History:  Procedure Laterality Date  . ABLATION    . CATARACT EXTRACTION    . lapinectomy  March 6th, 2020   Family History  Problem Relation Age of Onset  . Heart disease Father        MI at age 75  . Hyperlipidemia Sister   . Hypertension Sister    Social History   Socioeconomic History  . Marital status: Married    Spouse name: Fayrene FearingJames  . Number of children: 3  . Years of education: 10  . Highest education level: 10th grade  Occupational History  . Occupation: babysitter    Comment: retired  Engineer, productionocial Needs  . Financial resource strain: Not hard at all  . Food  insecurity    Worry: Never true    Inability: Never true  . Transportation needs    Medical: No    Non-medical: No  Tobacco Use  . Smoking status: Former Games developermoker  . Smokeless tobacco: Never Used  Substance and Sexual Activity  . Alcohol use: No  . Drug use: No  . Sexual activity: Not Currently  Lifestyle  . Physical activity    Days per week: 7 days    Minutes per session: 20 min  . Stress: Not at all  Relationships  . Social connections    Talks on phone: More than three times a week    Gets together: Once a week    Attends religious service: Not on file    Active member of club or organization: No    Attends meetings of clubs or organizations: Never    Relationship status: Married   Other Topics Concern  . Not on file  Social History Narrative   Recently had back surgery.   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   Cuts grass and cleans home    Tobacco Counseling Counseling given: No   Clinical Intake:  Pre-visit preparation completed: Yes  Pain : 0-10 Pain Score: 4  Pain Type: Other (Comment)(surgical back pain done in March) Pain Location: Back Pain Orientation: Left, Posterior Pain Radiating Towards: hips Pain Descriptors / Indicators: Aching, Throbbing, Discomfort Pain Onset: More than a month ago Pain Frequency: Intermittent Pain Relieving Factors: surgery  Pain Relieving Factors: surgery  Nutritional Risks: None Diabetes: No  How often do you need to have someone help you when you read instructions, pamphlets, or other written materials from your doctor or pharmacy?: 1 - Never What is the last grade level you completed in school?: 10  Interpreter Needed?: No  Information entered by :: Carolin SicksKim Larenda Reedy, LPN   Activities of Daily Living In your present state of health, do you have any difficulty performing the following activities: 09/02/2018  Hearing? N  Vision? Y  Difficulty concentrating or making decisions? N  Walking or climbing stairs? N  Dressing or bathing? N  Doing errands, shopping? N  Preparing Food and eating ? N  Using the Toilet? N  In the past six months, have you accidently leaked urine? N  Do you have problems with loss of bowel control? N  Managing your Medications? N  Managing your Finances? N  Housekeeping or managing your Housekeeping? N  Some recent data might be hidden     Immunizations and Health Maintenance Immunization History  Administered Date(s) Administered  . Influenza Split 11/27/2011  . Influenza Whole 12/12/2007, 12/23/2008, 12/13/2009, 10/31/2010  . Influenza, High Dose Seasonal PF 12/04/2016, 12/17/2017  . Influenza,inj,Quad PF,6+ Mos 12/09/2012, 12/10/2013, 11/30/2014, 01/14/2015, 11/29/2015  .  Pneumococcal Conjugate-13 08/24/2014  . Pneumococcal Polysaccharide-23 10/30/2011  . Tdap 10/31/2010  . Zoster 05/11/2012   There are no preventive care reminders to display for this patient.  Patient Care Team: Agapito GamesMetheney, Catherine D, MD as PCP - General Wille GlaserWallmeyer, Darcella GasmanKenneth W., MD as Referring Physician (Cardiology) Sandy SalaamAkbary, Ali, MD as Referring Physician (Cardiology)  Indicate any recent Medical Services you may have received from other than [facility] providers in the past year (date may be approximate).     Assessment:   This is a routine wellness examination for Jessica Dawson.Physical assessment deferred to PCP.    Hearing/Vision screen  Hearing Screening   125Hz  250Hz  500Hz  1000Hz  2000Hz  3000Hz  4000Hz  6000Hz  8000Hz   Right ear:  Left ear:           Comments: Hearing test done and patient repeated back all 3 words given   Visual Acuity Screening   Right eye Left eye Both eyes  Without correction: 20/30 20/50 20/40   With correction:       Dietary issues and exercise activities discussed: Current Exercise Habits: Home exercise routine, Type of exercise: stretching;walking, Time (Minutes): 20, Frequency (Times/Week): 3, Weekly Exercise (Minutes/Week): 60, Intensity: Mild, Exercise limited by: orthopedic condition(s)(recent back surgery) Diet Eats a healthy diet of vegetables and meats and protein. Breakfast: blueberry pancakes, [place] bacon, cereal Lunch:  Burgers, lima beans corn and cornbread, cabbage. Dinner: Meat and home grown vegetables. Drinks 2 cups of coffee a day Drinks 3 glasses of water a day. Advised to increase water intake.      Goals    . DIET - INCREASE WATER INTAKE     Increase water intake to 32 ounces a day to start.      Depression Screen PHQ 2/9 Scores 09/02/2018 12/17/2017 01/29/2017 07/24/2016 07/26/2015 02/25/2014 12/09/2012  PHQ - 2 Score 0 0 0 0 0 0 0  PHQ- 9 Score - - - 2 - - -    Fall Risk Fall Risk  09/02/2018 12/17/2017 01/29/2017 07/24/2016  07/26/2015  Falls in the past year? 1 No No No No  Follow up Falls prevention discussed - - - -    Is the patient's home free of loose throw rugs in walkways, pet beds, electrical cords, etc?   yes      Grab bars in the bathroom? no      Handrails on the stairs?   yes      Adequate lighting?   yes   Cognitive Function:     6CIT Screen 09/02/2018  What Year? 0 points  What month? 0 points  What time? 0 points  Count back from 20 0 points  Months in reverse 0 points  Repeat phrase 2 points  Total Score 2    Screening Tests Health Maintenance  Topic Date Due  . INFLUENZA VACCINE  10/12/2018  . TETANUS/TDAP  10/30/2020  . COLONOSCOPY  01/24/2021  . DEXA SCAN  Completed  . Hepatitis C Screening  Completed  . PNA vac Low Risk Adult  Completed      Plan:      Jessica Dawson , Thank you for taking time to come for your Medicare Wellness Visit. I appreciate your ongoing commitment to your health goals. Please review the following plan we discussed and let me know if I can assist you in the future.  Please schedule your next medicare wellness visit with me in 1 yr. Bring a copy of your living will and/or healthcare power of attorney to your next office visit. Continue doing brain stimulating activities (puzzles, reading, adult coloring books, staying active) to keep memory sharp.   These are the goals we discussed: Goals    . DIET - INCREASE WATER INTAKE     Increase water intake to 32 ounces a day to start.       This is a list of the screening recommended for you and due dates:  Health Maintenance  Topic Date Due  . Flu Shot  10/12/2018  . Tetanus Vaccine  10/30/2020  . Colon Cancer Screening  01/24/2021  . DEXA scan (bone density measurement)  Completed  .  Hepatitis C: One time screening is recommended by Center for Disease Control  (CDC) for  adults  born from [birth year] through 1967.   Completed  . Pneumonia vaccines  Completed     I have personally reviewed and noted  the following in the patient's chart:   . Medical and social history . Use of alcohol, tobacco or illicit drugs  . Current medications and supplements . Functional ability and status . Nutritional status . Physical activity . Advanced directives . List of other physicians . Hospitalizations, surgeries, and ER visits in previous 12 months . Vitals . Screenings to include cognitive, depression, and falls . Referrals and appointments  In addition, I have reviewed and discussed with patient certain preventive protocols, quality metrics, and best practice recommendations. A written personalized care plan for preventive services as well as general preventive health recommendations were provided to patient.     Normand SloopKimberly A Zaydan Papesh, LPN   1/61/09606/22/2020

## 2018-09-02 NOTE — Patient Instructions (Signed)
Jessica Dawson , Thank you for taking time to come for your Medicare Wellness Visit. I appreciate your ongoing commitment to your health goals. Please review the following plan we discussed and let me know if I can assist you in the future.  Please schedule your next medicare wellness visit with me in 1 yr. Bring a copy of your living will and/or healthcare power of attorney to your next office visit. Continue doing brain stimulating activities (puzzles, reading, adult coloring books, staying active) to keep memory sharp.   These are the goals we discussed: Goals    . DIET - INCREASE WATER INTAKE     Increase water intake to 32 ounces a day to start.

## 2018-09-03 LAB — COMPLETE METABOLIC PANEL WITH GFR
AG Ratio: 1.5 (calc) (ref 1.0–2.5)
ALT: 16 U/L (ref 6–29)
AST: 22 U/L (ref 10–35)
Albumin: 4.4 g/dL (ref 3.6–5.1)
Alkaline phosphatase (APISO): 86 U/L (ref 37–153)
BUN/Creatinine Ratio: 28 (calc) — ABNORMAL HIGH (ref 6–22)
BUN: 31 mg/dL — ABNORMAL HIGH (ref 7–25)
CO2: 24 mmol/L (ref 20–32)
Calcium: 9.8 mg/dL (ref 8.6–10.4)
Chloride: 105 mmol/L (ref 98–110)
Creat: 1.09 mg/dL — ABNORMAL HIGH (ref 0.60–0.93)
GFR, Est African American: 58 mL/min/{1.73_m2} — ABNORMAL LOW (ref >=60)
GFR, Est Non African American: 50 mL/min/{1.73_m2} — ABNORMAL LOW (ref >=60)
Globulin: 2.9 g/dL (calc) (ref 1.9–3.7)
Glucose, Bld: 91 mg/dL (ref 65–99)
Potassium: 4.6 mmol/L (ref 3.5–5.3)
Sodium: 139 mmol/L (ref 135–146)
Total Bilirubin: 0.5 mg/dL (ref 0.2–1.2)
Total Protein: 7.3 g/dL (ref 6.1–8.1)

## 2018-09-03 LAB — TSH: TSH: 5.92 mIU/L — ABNORMAL HIGH (ref 0.40–4.50)

## 2018-10-01 ENCOUNTER — Other Ambulatory Visit: Payer: Self-pay | Admitting: Family Medicine

## 2018-10-01 DIAGNOSIS — E039 Hypothyroidism, unspecified: Secondary | ICD-10-CM

## 2018-10-14 DIAGNOSIS — M5136 Other intervertebral disc degeneration, lumbar region: Secondary | ICD-10-CM | POA: Diagnosis not present

## 2018-10-14 DIAGNOSIS — M48061 Spinal stenosis, lumbar region without neurogenic claudication: Secondary | ICD-10-CM | POA: Diagnosis not present

## 2018-10-14 DIAGNOSIS — M47816 Spondylosis without myelopathy or radiculopathy, lumbar region: Secondary | ICD-10-CM | POA: Diagnosis not present

## 2018-10-14 DIAGNOSIS — M4316 Spondylolisthesis, lumbar region: Secondary | ICD-10-CM | POA: Diagnosis not present

## 2018-10-14 DIAGNOSIS — M5416 Radiculopathy, lumbar region: Secondary | ICD-10-CM | POA: Diagnosis not present

## 2018-11-26 ENCOUNTER — Ambulatory Visit (INDEPENDENT_AMBULATORY_CARE_PROVIDER_SITE_OTHER): Payer: Medicare HMO | Admitting: Family Medicine

## 2018-11-26 ENCOUNTER — Other Ambulatory Visit: Payer: Self-pay

## 2018-11-26 DIAGNOSIS — Z23 Encounter for immunization: Secondary | ICD-10-CM | POA: Diagnosis not present

## 2018-11-26 NOTE — Progress Notes (Signed)
Pt here for flu shot. Afebrile,no recent illness. Vaccination given, pt tolerated well..Joi Leyva Lynetta, CMA  

## 2018-12-30 ENCOUNTER — Ambulatory Visit: Payer: Medicare HMO | Admitting: Family Medicine

## 2019-01-06 ENCOUNTER — Encounter: Payer: Self-pay | Admitting: Family Medicine

## 2019-01-06 ENCOUNTER — Ambulatory Visit (INDEPENDENT_AMBULATORY_CARE_PROVIDER_SITE_OTHER): Payer: Medicare HMO | Admitting: Family Medicine

## 2019-01-06 ENCOUNTER — Other Ambulatory Visit: Payer: Self-pay

## 2019-01-06 VITALS — BP 135/57 | HR 57 | Ht <= 58 in | Wt 110.0 lb

## 2019-01-06 DIAGNOSIS — M5416 Radiculopathy, lumbar region: Secondary | ICD-10-CM

## 2019-01-06 DIAGNOSIS — E7800 Pure hypercholesterolemia, unspecified: Secondary | ICD-10-CM

## 2019-01-06 DIAGNOSIS — E039 Hypothyroidism, unspecified: Secondary | ICD-10-CM

## 2019-01-06 DIAGNOSIS — I10 Essential (primary) hypertension: Secondary | ICD-10-CM | POA: Diagnosis not present

## 2019-01-06 NOTE — Assessment & Plan Note (Signed)
Due to recheck TSH.  She says she still not perfectly taking her thyroid medicine but says she has been better about it so we will just recheck her level today.  If it still elevated then we may need to adjust her dose.  She is currently taking levothyroxine 75 mcg daily.

## 2019-01-06 NOTE — Assessment & Plan Note (Signed)
BP initially borderline elevated today but home blood pressure she reports have looked great mostly under 135.

## 2019-01-06 NOTE — Progress Notes (Signed)
Established Patient Office Visit  Subjective:  Patient ID: Jessica Dawson, female    DOB: 12/31/1945  Age: 73 y.o. MRN: [id number]  CC:  Chief Complaint  Patient presents with  . Hypertension    HPI Jessica Dawson presents for   Hypertension- Pt denies chest pain, SOB, dizziness, or heart palpitations.  Taking meds as directed w/o problems.  Denies medication side effects.    Hypothyroidism - Taking medication regularly in the AM away from food and vitamins, etc. No recent change to skin, hair, or energy levels.  She still having a lot of pain particularly in her back and her hips and her right upper leg.  At this point the neurosurgeon said they would not do anything unless she felt like she was actually getting worse.  They recommend that she continue with medical management.  She is currently on gabapentin at bedtime and occasionally will take a half of a tab naproxen and Tylenol.  She says that does seem to help.  She is trying to keep her weight down because she knows when she gains weight it tends to bother her back more.   Past Medical History:  Diagnosis Date  . GERD (gastroesophageal reflux disease)   . Hiatal hernia   . Hyperlipidemia   . Hypertension   . Hypothyroid   . SVT (supraventricular tachycardia) (HCC)     Past Surgical History:  Procedure Laterality Date  . ABLATION    . CATARACT EXTRACTION    . lapinectomy  March 6th, 2020    Family History  Problem Relation Age of Onset  . Heart disease Father        MI at age 869  . Hyperlipidemia Sister   . Hypertension Sister     Social History   Socioeconomic History  . Marital status: Married    Spouse name: Fayrene FearingJames  . Number of children: 3  . Years of education: 10  . Highest education level: 10th grade  Occupational History  . Occupation: babysitter    Comment: retired  Engineer, productionocial Needs  . Financial resource strain: Not hard at all  . Food insecurity    Worry: Never true    Inability: Never true  .  Transportation needs    Medical: No    Non-medical: No  Tobacco Use  . Smoking status: Former Games developermoker  . Smokeless tobacco: Never Used  Substance and Sexual Activity  . Alcohol use: No  . Drug use: No  . Sexual activity: Not Currently  Lifestyle  . Physical activity    Days per week: 7 days    Minutes per session: 20 min  . Stress: Not at all  Relationships  . Social connections    Talks on phone: More than three times a week    Gets together: Once a week    Attends religious service: Not on file    Active member of club or organization: No    Attends meetings of clubs or organizations: Never    Relationship status: Married  . Intimate partner violence    Fear of current or ex partner: No    Emotionally abused: No    Physically abused: No    Forced sexual activity: No  Other Topics Concern  . Not on file  Social History Narrative   Recently had back surgery.   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 aspirin 81 MG tablet Take 81 mg by mouth daily.      . Calcium-Vitamin D (CALTRATE 600 PLUS-VIT D PO) Take by mouth.      . clotrimazole (MYCELEX) 10 MG troche Take 1 tablet (10 mg total) by mouth 5 (five) times daily. 140 tablet 2  . gabapentin (NEURONTIN) 300 MG capsule Take 1 capsule (300 mg total) by mouth at bedtime. 90 capsule 3  . levothyroxine (SYNTHROID) 75 MCG tablet Take 1 tablet by mouth once daily 90 tablet 0  . Multiple Vitamins-Minerals (CENTRUM SILVER PO) Take by mouth.      . naproxen (NAPROSYN) 500 MG tablet TAKE 1 TABLET BY MOUTH TWICE DAILY WITH MEALS 180 tablet 1  . omeprazole (PRILOSEC) 40 MG capsule TAKE 1 BY MOUTH DAILY 90 capsule 1  . telmisartan (MICARDIS) 80 MG tablet Take 1 tablet by mouth once daily 90 tablet 2   No facility-administered medications prior to visit.     Allergies  Allergen Reactions  . Crestor [Rosuvastatin] Other (See Comments)     Joint aches   . Lipitor [Atorvastatin] Other (See Comments)    Muscle cramps   . Livalo [Pitavastatin] Other (See Comments)    constipation  . Pravastatin Sodium Other (See Comments)    REACTION: Myalgias    ROS Review of Systems    Objective:    Physical Exam  BP (!) 135/57   Pulse (!) 57   Ht 4\' 10"  (1.473 m)   Wt 110 lb (49.9 kg)   SpO2 100%   BMI 22.99 kg/m  Wt Readings from Last 3 Encounters:  01/06/19 110 lb (49.9 kg)  09/02/18 113 lb (51.3 kg)  06/17/18 111 lb (50.3 kg)     There are no preventive care reminders to display for this patient.  There are no preventive care reminders to display for this patient.  Lab Results  Component Value Date   TSH 5.92 (H) 09/02/2018   Lab Results  Component Value Date   WBC 6.8 09/02/2012   HGB 12.7 09/02/2012   HCT 38.3 09/02/2012   MCV 89.1 09/02/2012   PLT 354 09/02/2012   Lab Results  Component Value Date   NA 139 09/02/2018   K 4.6 09/02/2018   CO2 24 09/02/2018   GLUCOSE 91 09/02/2018   BUN 31 (H) 09/02/2018   CREATININE 1.09 (H) 09/02/2018   BILITOT 0.5 09/02/2018   ALKPHOS 60 07/24/2016   AST 22 09/02/2018   ALT 16 09/02/2018   PROT 7.3 09/02/2018   ALBUMIN 4.4 07/24/2016   CALCIUM 9.8 09/02/2018   Lab Results  Component Value Date   CHOL 316 (H) 12/17/2017   Lab Results  Component Value Date   HDL 44 (L) 12/17/2017   Lab Results  Component Value Date   LDLCALC 234 (H) 12/17/2017   Lab Results  Component Value Date   TRIG 197 (H) 12/17/2017   Lab Results  Component Value Date   CHOLHDL 7.2 (H) 12/17/2017   No results found for: HGBA1C    Assessment & Plan:   Problem List Items Addressed This Visit      Cardiovascular and Mediastinum   ESSENTIAL HYPERTENSION, BENIGN - Primary    BP initially borderline elevated today but home blood pressure she reports have looked great mostly under 135.        Endocrine   Hypothyroidism    Due to recheck TSH.  She says she still  not perfectly taking her thyroid  medicine but says she has been better about it so we will just recheck her level today.  If it still elevated then we may need to adjust her dose.  She is currently taking levothyroxine 75 mcg daily.      Relevant Orders   TSH     Nervous and Auditory   Left lumbar radiculitis    Right now holding off on any type of procedure or surgery.  Managing with gabapentin, naproxen and Tylenol.        Other   Hyperlipidemia   Relevant Orders   Lipid Panel w/reflex Direct LDL      No orders of the defined types were placed in this encounter.   Follow-up: Return in about 6 months (around 07/07/2019) for Hypertension.    Beatrice Lecher, MD

## 2019-01-06 NOTE — Assessment & Plan Note (Signed)
Right now holding off on any type of procedure or surgery.  Managing with gabapentin, naproxen and Tylenol.

## 2019-01-07 LAB — TSH: TSH: 2.88 mIU/L (ref 0.40–4.50)

## 2019-01-07 LAB — LIPID PANEL W/REFLEX DIRECT LDL
Cholesterol: 294 mg/dL — ABNORMAL HIGH (ref <200)
HDL: 49 mg/dL — ABNORMAL LOW (ref >=50)
LDL Cholesterol (Calc): 213 mg/dL (calc) — ABNORMAL HIGH
Non-HDL Cholesterol (Calc): 245 mg/dL (calc) — ABNORMAL HIGH (ref <130)
Total CHOL/HDL Ratio: 6 (calc) — ABNORMAL HIGH (ref <5.0)
Triglycerides: 159 mg/dL — ABNORMAL HIGH (ref <150)

## 2019-01-07 MED ORDER — PITAVASTATIN CALCIUM 1 MG PO TABS: 1.0000 mg | ORAL_TABLET | ORAL | 3 refills | Status: DC

## 2019-01-07 NOTE — Addendum Note (Signed)
Addended by: Beatrice Lecher D on: 01/07/2019 09:30 PM   Modules accepted: Orders

## 2019-04-07 ENCOUNTER — Other Ambulatory Visit: Payer: Self-pay | Admitting: Family Medicine

## 2019-04-07 DIAGNOSIS — E039 Hypothyroidism, unspecified: Secondary | ICD-10-CM

## 2019-04-10 ENCOUNTER — Telehealth: Payer: Self-pay

## 2019-04-10 DIAGNOSIS — E039 Hypothyroidism, unspecified: Secondary | ICD-10-CM

## 2019-04-10 NOTE — Telephone Encounter (Signed)
[facility] called and states the manufacturer for Levothyroxine has changed. I advised the pharmacy ok and called the patient. Jessica Dawson states she will come and have her TSH rechecked in 6 weeks after starting the Levothyroxine with the different manufacturer. TSH ordered.

## 2019-04-10 NOTE — Telephone Encounter (Signed)
Agree with documentation as above.   Chery Giusto, MD  

## 2019-05-20 DIAGNOSIS — E039 Hypothyroidism, unspecified: Secondary | ICD-10-CM | POA: Diagnosis not present

## 2019-05-21 LAB — TSH: TSH: 1.91 mIU/L (ref 0.40–4.50)

## 2019-05-21 NOTE — Telephone Encounter (Signed)
All labs are normal. 

## 2019-06-25 ENCOUNTER — Other Ambulatory Visit: Payer: Self-pay | Admitting: Family Medicine

## 2019-07-01 ENCOUNTER — Other Ambulatory Visit: Payer: Self-pay | Admitting: Family Medicine

## 2019-07-07 ENCOUNTER — Encounter: Payer: Self-pay | Admitting: Family Medicine

## 2019-07-07 ENCOUNTER — Other Ambulatory Visit: Payer: Self-pay

## 2019-07-07 ENCOUNTER — Ambulatory Visit (INDEPENDENT_AMBULATORY_CARE_PROVIDER_SITE_OTHER): Payer: Medicare HMO | Admitting: Family Medicine

## 2019-07-07 VITALS — BP 153/58 | HR 60 | Ht <= 58 in | Wt 115.0 lb

## 2019-07-07 DIAGNOSIS — I459 Conduction disorder, unspecified: Secondary | ICD-10-CM

## 2019-07-07 DIAGNOSIS — H527 Unspecified disorder of refraction: Secondary | ICD-10-CM | POA: Diagnosis not present

## 2019-07-07 DIAGNOSIS — H47323 Drusen of optic disc, bilateral: Secondary | ICD-10-CM | POA: Diagnosis not present

## 2019-07-07 DIAGNOSIS — H43811 Vitreous degeneration, right eye: Secondary | ICD-10-CM | POA: Diagnosis not present

## 2019-07-07 DIAGNOSIS — E039 Hypothyroidism, unspecified: Secondary | ICD-10-CM | POA: Diagnosis not present

## 2019-07-07 DIAGNOSIS — R0789 Other chest pain: Secondary | ICD-10-CM

## 2019-07-07 DIAGNOSIS — L299 Pruritus, unspecified: Secondary | ICD-10-CM | POA: Diagnosis not present

## 2019-07-07 DIAGNOSIS — K219 Gastro-esophageal reflux disease without esophagitis: Secondary | ICD-10-CM | POA: Diagnosis not present

## 2019-07-07 DIAGNOSIS — H40033 Anatomical narrow angle, bilateral: Secondary | ICD-10-CM | POA: Diagnosis not present

## 2019-07-07 DIAGNOSIS — Z961 Presence of intraocular lens: Secondary | ICD-10-CM | POA: Diagnosis not present

## 2019-07-07 DIAGNOSIS — H53001 Unspecified amblyopia, right eye: Secondary | ICD-10-CM | POA: Diagnosis not present

## 2019-07-07 MED ORDER — PITAVASTATIN CALCIUM 1 MG PO TABS: 1.0000 mg | ORAL_TABLET | ORAL | 3 refills | Status: DC

## 2019-07-07 MED ORDER — TRIAMCINOLONE ACETONIDE 0.1 % EX CREA: 1.0000 "application " | TOPICAL_CREAM | Freq: Every day | CUTANEOUS | 0 refills | Status: DC

## 2019-07-07 NOTE — Assessment & Plan Note (Signed)
Viewed with her last thyroid results which look fantastic.  TSH was 1.9 it looked perfect.

## 2019-07-07 NOTE — Progress Notes (Signed)
Established Patient Office Visit  Subjective:  Patient ID: Jessica Dawson, female    DOB: 1945/03/16  Age: 74 y.o. MRN: [id number]  CC:  Chief Complaint  Patient presents with  . Hypertension    HPI Jessica Dawson presents for   GERD - reports has been having more chest pain and feeling like her heart is skipping beats, so has been taking more reflux medication.  He says when she takes the reflux medication it does seem to help.  She says her pulse typically runs between 50 and 60s.  She says the symptoms occur randomly they do not seem to be triggered by anything specific or any certain time a day necessarily.  Itching on the outside of her left leg.   Lotion helps some.  She says that just in that 1 area she has not noticed any distinct rash but she says it is incredibly itchy to the point that it can keep her awake at times.  Hypothyroidism - Taking medication regularly in the AM away from food and vitamins, etc. No recent change to skin, hair, or energy levels.     Past Medical History:  Diagnosis Date  . GERD (gastroesophageal reflux disease)   . Hiatal hernia   . Hyperlipidemia   . Hypertension   . Hypothyroid   . SVT (supraventricular tachycardia) (HCC)     Past Surgical History:  Procedure Laterality Date  . ABLATION    . CATARACT EXTRACTION    . lapinectomy  March 6th, 2020    Family History  Problem Relation Age of Onset  . Heart disease Father        MI at age 64  . Hyperlipidemia Sister   . Hypertension Sister     Social History   Socioeconomic History  . Marital status: Married    Spouse name: Fayrene Fearing  . Number of children: 3  . Years of education: 10  . Highest education level: 10th grade  Occupational History  . Occupation: babysitter    Comment: retired  Tobacco Use  . Smoking status: Former Games developer  . Smokeless tobacco: Never Used  Substance and Sexual Activity  . Alcohol use: No  . Drug use: No  . Sexual activity: Not Currently  Other  Topics Concern  . Not on file  Social History Narrative   Recently had back surgery.   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   Cuts grass and cleans home   Social Determinants of Health   Financial Resource Strain: Low Risk   . Difficulty of Paying Living Expenses: Not hard at all  Food Insecurity: No Food Insecurity  . Worried About Programme researcher, broadcasting/film/video in the Last Year: Never true  . Ran Out of Food in the Last Year: Never true  Transportation Needs: No Transportation Needs  . Lack of Transportation (Medical): No  . Lack of Transportation (Non-Medical): No  Physical Activity: Insufficiently Active  . Days of Exercise per Week: 7 days  . Minutes of Exercise per Session: 20 min  Stress: No Stress Concern Present  . Feeling of Stress : Not at all  Social Connections: Unknown  . Frequency of Communication with Friends and Family: More than three times a week  . Frequency of Social Gatherings with Friends and Family: Once a week  . Attends Religious Services: Not on file  . Active Member of Clubs or Organizations: No  . Attends Banker Meetings: Never  . Marital Status: Married  Intimate Partner Violence: Not At Risk  . Fear of Current or Ex-Partner: No  . Emotionally Abused: No  . Physically Abused: No  . Sexually Abused: No    Outpatient Medications Prior to Visit  Medication Sig Dispense Refill  . aspirin 81 MG tablet Take 81 mg by mouth daily.      . Calcium-Vitamin D (CALTRATE 600 PLUS-VIT D PO) Take by mouth.      . clotrimazole (MYCELEX) 10 MG troche Take 1 tablet (10 mg total) by mouth 5 (five) times daily. 140 tablet 2  . gabapentin (NEURONTIN) 300 MG capsule Take 1 capsule by mouth at bedtime 90 capsule 0  . levothyroxine (SYNTHROID) 75 MCG tablet Take 1 tablet by mouth once daily 90 tablet 0  . Multiple Vitamins-Minerals (CENTRUM SILVER PO) Take by mouth.      . naproxen (NAPROSYN) 500 MG tablet TAKE 1 TABLET BY MOUTH TWICE DAILY WITH MEALS 180 tablet 0  .  omeprazole (PRILOSEC) 40 MG capsule Take 1 capsule by mouth once daily 90 capsule 3  . telmisartan (MICARDIS) 80 MG tablet Take 1 tablet by mouth once daily 90 tablet 0  . Pitavastatin Calcium 1 MG TABS Take 1 tablet (1 mg total) by mouth 2 (two) times a week. 24 tablet 3   No facility-administered medications prior to visit.    Allergies  Allergen Reactions  . Crestor [Rosuvastatin] Other (See Comments)    Joint aches   . Lipitor [Atorvastatin] Other (See Comments)    Muscle cramps   . Livalo [Pitavastatin] Other (See Comments)    constipation  . Pravastatin Sodium Other (See Comments)    REACTION: Myalgias    ROS Review of Systems    Objective:    Physical Exam  Constitutional: She is oriented to person, place, and time. She appears well-developed and well-nourished.  HENT:  Head: Normocephalic and atraumatic.  Cardiovascular: Normal rate, regular rhythm and normal heart sounds.  Pulmonary/Chest: Effort normal and breath sounds normal.  Musculoskeletal:        General: No edema.  Neurological: She is alert and oriented to person, place, and time.  Skin: Skin is warm and dry.  Legs in general are very dry.  On her left outer lower leg there are some excoriations and she is starting to get some thickening of the skin from scratching.  Psychiatric: She has a normal mood and affect. Her behavior is normal.    BP (!) 153/58   Pulse 60   Ht 4\' 10"  (1.473 m)   Wt 115 lb (52.2 kg)   SpO2 100%   BMI 24.04 kg/m  Wt Readings from Last 3 Encounters:  07/07/19 115 lb (52.2 kg)  01/06/19 110 lb (49.9 kg)  09/02/18 113 lb (51.3 kg)     There are no preventive care reminders to display for this patient.  There are no preventive care reminders to display for this patient.  Lab Results  Component Value Date   TSH 1.91 05/20/2019   Lab Results  Component Value Date   WBC 6.8 09/02/2012   HGB 12.7 09/02/2012   HCT 38.3 09/02/2012   MCV 89.1 09/02/2012   PLT 354  09/02/2012   Lab Results  Component Value Date   NA 139 09/02/2018   K 4.6 09/02/2018   CO2 24 09/02/2018   GLUCOSE 91 09/02/2018   BUN 31 (H) 09/02/2018   CREATININE 1.09 (H) 09/02/2018   BILITOT 0.5 09/02/2018   ALKPHOS 60 07/24/2016   AST  22 09/02/2018   ALT 16 09/02/2018   PROT 7.3 09/02/2018   ALBUMIN 4.4 07/24/2016   CALCIUM 9.8 09/02/2018   Lab Results  Component Value Date   CHOL 294 (H) 01/06/2019   Lab Results  Component Value Date   HDL 49 (L) 01/06/2019   Lab Results  Component Value Date   LDLCALC 213 (H) 01/06/2019   Lab Results  Component Value Date   TRIG 159 (H) 01/06/2019   Lab Results  Component Value Date   CHOLHDL 6.0 (H) 01/06/2019   No results found for: HGBA1C    Assessment & Plan:   Problem List Items Addressed This Visit      Digestive   GERD    Reflux.  Discussed taking her PPI daily instead of as needed for at least the next month just to see if the chest pain sensation resolves on its own.  She really feels like it is reflux related she has been getting some brash and burning back up into the esophagus and back of her throat.  If the chest pain does not resolve then I want to know immediately.        Endocrine   Hypothyroidism    Viewed with her last thyroid results which look fantastic.  TSH was 1.9 it looked perfect.       Other Visit Diagnoses    Atypical chest pain    -  Primary   Itchy skin       Relevant Medications   triamcinolone cream (KENALOG) 0.1 %   Skipped heart beats          Skipped heartbeat-discussed that it could be PVCs or PACs.  But it could also be a sign of severe bradycardia.  Right now she says she just notices it for 1 or 2 beats it seems to be very brief and seems to be random.  Is not occurring daily.  She would prefer to keep an eye on it.  Discussed warning signs and symptoms.  Itchy skin - apply topical steroid cream in thin layer and then with a thick dye-free cream on top.    Atypical  chest pain -likely GERD related so did encourage her to start taking her PPI daily and if the symptoms persist then please let me know and we will do further cardiac work-up.  Meds ordered this encounter  Medications  . triamcinolone cream (KENALOG) 0.1 %    Sig: Apply 1 application topically daily.    Dispense:  30 g    Refill:  0  . Pitavastatin Calcium 1 MG TABS    Sig: Take 1 tablet (1 mg total) by mouth 2 (two) times a week.    Dispense:  24 tablet    Refill:  3    Follow-up: Return in about 6 months (around 01/06/2020) for BP/Thyroid.    Beatrice Lecher, MD

## 2019-07-07 NOTE — Assessment & Plan Note (Signed)
Reflux.  Discussed taking her PPI daily instead of as needed for at least the next month just to see if the chest pain sensation resolves on its own.  She really feels like it is reflux related she has been getting some brash and burning back up into the esophagus and back of her throat.  If the chest pain does not resolve then I want to know immediately.

## 2019-07-11 ENCOUNTER — Other Ambulatory Visit: Payer: Self-pay | Admitting: Family Medicine

## 2019-08-05 ENCOUNTER — Encounter: Payer: Self-pay | Admitting: Family Medicine

## 2019-08-05 ENCOUNTER — Ambulatory Visit (INDEPENDENT_AMBULATORY_CARE_PROVIDER_SITE_OTHER): Payer: Medicare HMO | Admitting: Family Medicine

## 2019-08-05 VITALS — BP 155/72 | HR 63 | Ht <= 58 in | Wt 113.0 lb

## 2019-08-05 DIAGNOSIS — W57XXXA Bitten or stung by nonvenomous insect and other nonvenomous arthropods, initial encounter: Secondary | ICD-10-CM | POA: Diagnosis not present

## 2019-08-05 DIAGNOSIS — R252 Cramp and spasm: Secondary | ICD-10-CM | POA: Diagnosis not present

## 2019-08-05 LAB — BASIC METABOLIC PANEL WITH GFR
BUN/Creatinine Ratio: 29 (calc) — ABNORMAL HIGH (ref 6–22)
BUN: 38 mg/dL — ABNORMAL HIGH (ref 7–25)
CO2: 25 mmol/L (ref 20–32)
Calcium: 9.6 mg/dL (ref 8.6–10.4)
Chloride: 106 mmol/L (ref 98–110)
Creat: 1.33 mg/dL — ABNORMAL HIGH (ref 0.60–0.93)
GFR, Est African American: 45 mL/min/{1.73_m2} — ABNORMAL LOW (ref >=60)
GFR, Est Non African American: 39 mL/min/{1.73_m2} — ABNORMAL LOW (ref >=60)
Glucose, Bld: 94 mg/dL (ref 65–139)
Potassium: 5.4 mmol/L — ABNORMAL HIGH (ref 3.5–5.3)
Sodium: 141 mmol/L (ref 135–146)

## 2019-08-05 MED ORDER — DOXYCYCLINE HYCLATE 100 MG PO TABS: 200.0000 mg | ORAL_TABLET | Freq: Once | ORAL | 0 refills | Status: AC

## 2019-08-05 NOTE — Patient Instructions (Signed)
Vitamin B6 30mg  a day for muscle cramps.

## 2019-08-05 NOTE — Progress Notes (Signed)
Acute Office Visit  Subjective:    Patient ID: Jessica Dawson, female    DOB: [birth year], 74 y.o.   MRN: [id number]  Chief Complaint  Patient presents with  . Insect Bite    HPI Patient is in today for "insect bite". She says he noticed it yesterday on the left shoulder just below her bra strap. She felt like something was biting at her yesterday and had her granddaughter look at it. They were able to remove the tick but it came out in pieces and so she came in today just to make sure that have been completely removed. She denies any fevers, chills or sweats. No other rash etc.  Past Medical History:  Diagnosis Date  . GERD (gastroesophageal reflux disease)   . Hiatal hernia   . Hyperlipidemia   . Hypertension   . Hypothyroid   . SVT (supraventricular tachycardia) (HCC)     Past Surgical History:  Procedure Laterality Date  . ABLATION    . CATARACT EXTRACTION    . lapinectomy  March 6th, 2020    Family History  Problem Relation Age of Onset  . Heart disease Father        MI at age 44  . Hyperlipidemia Sister   . Hypertension Sister     Social History   Socioeconomic History  . Marital status: Married    Spouse name: Jeneen Rinks  . Number of children: 3  . Years of education: 10  . Highest education level: 10th grade  Occupational History  . Occupation: babysitter    Comment: retired  Tobacco Use  . Smoking status: Former Research scientist (life sciences)  . Smokeless tobacco: Never Used  Substance and Sexual Activity  . Alcohol use: No  . Drug use: No  . Sexual activity: Not Currently  Other Topics Concern  . Not on file  Social History Narrative   Recently had back surgery.   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   Cuts grass and cleans home   Social Determinants of Health   Financial Resource Strain: Low Risk   . Difficulty of Paying Living Expenses: Not hard at all  Food Insecurity: No Food Insecurity  . Worried About Charity fundraiser in the Last Year: Never true  . Ran Out of  Food in the Last Year: Never true  Transportation Needs: No Transportation Needs  . Lack of Transportation (Medical): No  . Lack of Transportation (Non-Medical): No  Physical Activity: Insufficiently Active  . Days of Exercise per Week: 7 days  . Minutes of Exercise per Session: 20 min  Stress: No Stress Concern Present  . Feeling of Stress : Not at all  Social Connections: Unknown  . Frequency of Communication with Friends and Family: More than three times a week  . Frequency of Social Gatherings with Friends and Family: Once a week  . Attends Religious Services: Not on file  . Active Member of Clubs or Organizations: No  . Attends Archivist Meetings: Never  . Marital Status: Married  Human resources officer Violence: Not At Risk  . Fear of Current or Ex-Partner: No  . Emotionally Abused: No  . Physically Abused: No  . Sexually Abused: No    Outpatient Medications Prior to Visit  Medication Sig Dispense Refill  . aspirin 81 MG tablet Take 81 mg by mouth daily.      . Calcium-Vitamin D (CALTRATE 600 PLUS-VIT D PO) Take by mouth.      . gabapentin (NEURONTIN)  300 MG capsule Take 1 capsule by mouth at bedtime 90 capsule 0  . levothyroxine (SYNTHROID) 75 MCG tablet Take 1 tablet by mouth once daily 90 tablet 0  . Multiple Vitamins-Minerals (CENTRUM SILVER PO) Take by mouth.      . naproxen (NAPROSYN) 500 MG tablet TAKE 1 TABLET BY MOUTH TWICE DAILY WITH MEALS 180 tablet 0  . omeprazole (PRILOSEC) 40 MG capsule Take 1 capsule by mouth once daily 90 capsule 3  . Pitavastatin Calcium 1 MG TABS Take 1 tablet (1 mg total) by mouth 2 (two) times a week. 24 tablet 3  . telmisartan (MICARDIS) 80 MG tablet Take 1 tablet by mouth once daily 90 tablet 1  . clotrimazole (MYCELEX) 10 MG troche Take 1 tablet (10 mg total) by mouth 5 (five) times daily. 140 tablet 2  . triamcinolone cream (KENALOG) 0.1 % Apply 1 application topically daily. 30 g 0   No facility-administered medications prior  to visit.    Allergies  Allergen Reactions  . Crestor [Rosuvastatin] Other (See Comments)    Joint aches   . Lipitor [Atorvastatin] Other (See Comments)    Muscle cramps   . Livalo [Pitavastatin] Other (See Comments)    constipation  . Pravastatin Sodium Other (See Comments)    REACTION: Myalgias    Review of Systems     Objective:    Physical Exam Vitals reviewed.  Constitutional:      Appearance: She is well-developed.  HENT:     Head: Normocephalic and atraumatic.  Eyes:     Conjunctiva/sclera: Conjunctivae normal.  Cardiovascular:     Rate and Rhythm: Normal rate.  Pulmonary:     Effort: Pulmonary effort is normal.  Skin:    General: Skin is dry.     Coloration: Skin is not pale.     Comments: Erythematous papule on the left upper shoulder just below the bra strap with a small scab in the center. I looked with magnifying scope and did not see any remaining tissue etc.  Neurological:     Mental Status: She is alert and oriented to person, place, and time.  Psychiatric:        Behavior: Behavior normal.     BP (!) 155/72   Pulse 63   Ht 4\' 10"  (1.473 m)   Wt 113 lb (51.3 kg)   SpO2 100%   BMI 23.62 kg/m  Wt Readings from Last 3 Encounters:  08/05/19 113 lb (51.3 kg)  07/07/19 115 lb (52.2 kg)  01/06/19 110 lb (49.9 kg)    There are no preventive care reminders to display for this patient.  There are no preventive care reminders to display for this patient.   Lab Results  Component Value Date   TSH 1.91 05/20/2019   Lab Results  Component Value Date   WBC 6.8 09/02/2012   HGB 12.7 09/02/2012   HCT 38.3 09/02/2012   MCV 89.1 09/02/2012   PLT 354 09/02/2012   Lab Results  Component Value Date   NA 139 09/02/2018   K 4.6 09/02/2018   CO2 24 09/02/2018   GLUCOSE 91 09/02/2018   BUN 31 (H) 09/02/2018   CREATININE 1.09 (H) 09/02/2018   BILITOT 0.5 09/02/2018   ALKPHOS 60 07/24/2016   AST 22 09/02/2018   ALT 16 09/02/2018   PROT 7.3  09/02/2018   ALBUMIN 4.4 07/24/2016   CALCIUM 9.8 09/02/2018   Lab Results  Component Value Date   CHOL 294 (H) 01/06/2019  Lab Results  Component Value Date   HDL 49 (L) 01/06/2019   Lab Results  Component Value Date   LDLCALC 213 (H) 01/06/2019   Lab Results  Component Value Date   TRIG 159 (H) 01/06/2019   Lab Results  Component Value Date   CHOLHDL 6.0 (H) 01/06/2019   No results found for: HGBA1C     Assessment & Plan:   Problem List Items Addressed This Visit    None    Visit Diagnoses    Tick bite, initial encounter    -  Primary   Relevant Orders   BASIC METABOLIC PANEL WITH GFR   Muscle cramp       Relevant Orders   BASIC METABOLIC PANEL WITH GFR     Muscle cramping -discussed different causes including mild dehydration, electrolyte imbalance, foot wear etc.. Go ahead and check for electrolyte disturbance today. We discussed a trial of vitamin B6 as well as increasing her water intake which she admits she does not often do well with.  Tick bite-discussed watching and waiting it sounds like the tick was probably on for maybe 1 to 2 days at the most. Will treat with oral doxycycline 2 mg once for prophylaxis. Did give her symptoms to monitor for including rash, fevers chills, and headaches.   Meds ordered this encounter  Medications  . doxycycline (VIBRA-TABS) 100 MG tablet    Sig: Take 2 tablets (200 mg total) by mouth once for 1 dose.    Dispense:  2 tablet    Refill:  0     [name], MD

## 2019-08-05 NOTE — Progress Notes (Signed)
Tick bite on Saturday on L shoulder at bra strap. Unable to pull out in one piece. She has a small knot where the tick bite was.

## 2019-08-06 ENCOUNTER — Other Ambulatory Visit: Payer: Self-pay | Admitting: *Deleted

## 2019-08-06 DIAGNOSIS — E875 Hyperkalemia: Secondary | ICD-10-CM

## 2019-08-14 DIAGNOSIS — E875 Hyperkalemia: Secondary | ICD-10-CM | POA: Diagnosis not present

## 2019-08-14 LAB — BASIC METABOLIC PANEL WITH GFR
BUN/Creatinine Ratio: 26 (calc) — ABNORMAL HIGH (ref 6–22)
BUN: 28 mg/dL — ABNORMAL HIGH (ref 7–25)
CO2: 24 mmol/L (ref 20–32)
Calcium: 9.3 mg/dL (ref 8.6–10.4)
Chloride: 106 mmol/L (ref 98–110)
Creat: 1.07 mg/dL — ABNORMAL HIGH (ref 0.60–0.93)
GFR, Est African American: 58 mL/min/{1.73_m2} — ABNORMAL LOW (ref >=60)
GFR, Est Non African American: 50 mL/min/{1.73_m2} — ABNORMAL LOW (ref >=60)
Glucose, Bld: 83 mg/dL (ref 65–139)
Potassium: 4.9 mmol/L (ref 3.5–5.3)
Sodium: 139 mmol/L (ref 135–146)

## 2019-08-25 ENCOUNTER — Other Ambulatory Visit: Payer: Self-pay | Admitting: Family Medicine

## 2019-08-25 DIAGNOSIS — E039 Hypothyroidism, unspecified: Secondary | ICD-10-CM

## 2019-09-01 DIAGNOSIS — M47816 Spondylosis without myelopathy or radiculopathy, lumbar region: Secondary | ICD-10-CM | POA: Diagnosis not present

## 2019-09-01 DIAGNOSIS — M5136 Other intervertebral disc degeneration, lumbar region: Secondary | ICD-10-CM | POA: Diagnosis not present

## 2019-09-01 DIAGNOSIS — G8929 Other chronic pain: Secondary | ICD-10-CM | POA: Diagnosis not present

## 2019-09-01 DIAGNOSIS — M5416 Radiculopathy, lumbar region: Secondary | ICD-10-CM | POA: Diagnosis not present

## 2019-09-01 DIAGNOSIS — M5442 Lumbago with sciatica, left side: Secondary | ICD-10-CM | POA: Diagnosis not present

## 2019-09-01 DIAGNOSIS — M4316 Spondylolisthesis, lumbar region: Secondary | ICD-10-CM | POA: Diagnosis not present

## 2019-09-17 ENCOUNTER — Other Ambulatory Visit: Payer: Self-pay | Admitting: Family Medicine

## 2019-09-22 DIAGNOSIS — M5136 Other intervertebral disc degeneration, lumbar region: Secondary | ICD-10-CM | POA: Diagnosis not present

## 2019-09-22 DIAGNOSIS — M4726 Other spondylosis with radiculopathy, lumbar region: Secondary | ICD-10-CM | POA: Diagnosis not present

## 2019-10-22 DIAGNOSIS — M5136 Other intervertebral disc degeneration, lumbar region: Secondary | ICD-10-CM | POA: Diagnosis not present

## 2019-10-22 DIAGNOSIS — M48062 Spinal stenosis, lumbar region with neurogenic claudication: Secondary | ICD-10-CM | POA: Diagnosis not present

## 2019-10-22 DIAGNOSIS — M4316 Spondylolisthesis, lumbar region: Secondary | ICD-10-CM | POA: Diagnosis not present

## 2019-10-27 ENCOUNTER — Telehealth: Payer: Self-pay

## 2019-10-27 NOTE — Telephone Encounter (Signed)
Patient called stating that she wanted to speak with Tonya. I advised her Archie Patten was in clinic and patient decline to leave a message for me to give to her. She request Tonya call her back, not urgent but does want call back today. Patient aware call back will not happen until after clinic at end of day

## 2019-11-03 NOTE — Telephone Encounter (Signed)
Spoke w/pt about concerns with her husband. She asked about getting an appt for them in Oct.

## 2019-11-06 ENCOUNTER — Other Ambulatory Visit: Payer: Self-pay | Admitting: Family Medicine

## 2019-12-12 DIAGNOSIS — M5416 Radiculopathy, lumbar region: Secondary | ICD-10-CM | POA: Diagnosis not present

## 2019-12-12 DIAGNOSIS — M4316 Spondylolisthesis, lumbar region: Secondary | ICD-10-CM | POA: Diagnosis not present

## 2019-12-12 DIAGNOSIS — M5136 Other intervertebral disc degeneration, lumbar region: Secondary | ICD-10-CM | POA: Diagnosis not present

## 2019-12-12 DIAGNOSIS — M47816 Spondylosis without myelopathy or radiculopathy, lumbar region: Secondary | ICD-10-CM | POA: Diagnosis not present

## 2020-01-05 ENCOUNTER — Other Ambulatory Visit: Payer: Self-pay | Admitting: Family Medicine

## 2020-01-05 ENCOUNTER — Ambulatory Visit: Payer: Medicare HMO | Admitting: Family Medicine

## 2020-01-05 DIAGNOSIS — E039 Hypothyroidism, unspecified: Secondary | ICD-10-CM

## 2020-01-06 ENCOUNTER — Ambulatory Visit (INDEPENDENT_AMBULATORY_CARE_PROVIDER_SITE_OTHER): Payer: Medicare HMO | Admitting: Family Medicine

## 2020-01-06 ENCOUNTER — Encounter: Payer: Self-pay | Admitting: Family Medicine

## 2020-01-06 ENCOUNTER — Other Ambulatory Visit: Payer: Self-pay

## 2020-01-06 VITALS — BP 141/62 | HR 76 | Ht <= 58 in | Wt 109.0 lb

## 2020-01-06 DIAGNOSIS — Z23 Encounter for immunization: Secondary | ICD-10-CM

## 2020-01-06 DIAGNOSIS — N1831 Chronic kidney disease, stage 3a: Secondary | ICD-10-CM | POA: Diagnosis not present

## 2020-01-06 DIAGNOSIS — E039 Hypothyroidism, unspecified: Secondary | ICD-10-CM | POA: Diagnosis not present

## 2020-01-06 DIAGNOSIS — E7800 Pure hypercholesterolemia, unspecified: Secondary | ICD-10-CM

## 2020-01-06 DIAGNOSIS — I10 Essential (primary) hypertension: Secondary | ICD-10-CM | POA: Diagnosis not present

## 2020-01-06 MED ORDER — HYDROCHLOROTHIAZIDE 12.5 MG PO CAPS: 12.5000 mg | ORAL_CAPSULE | Freq: Every day | ORAL | 1 refills | Status: DC

## 2020-01-06 MED ORDER — SIMVASTATIN 20 MG PO TABS: ORAL_TABLET | ORAL | 3 refills | Status: DC

## 2020-01-06 NOTE — Patient Instructions (Addendum)
Go for your labs in about 2 weeks after you start the new cholesterol pill.

## 2020-01-06 NOTE — Assessment & Plan Note (Signed)
Following renal function every 6 months. 

## 2020-01-06 NOTE — Assessment & Plan Note (Signed)
Taking medications regularly.  Due to recheck TSH.

## 2020-01-06 NOTE — Progress Notes (Signed)
Established Patient Office Visit  Subjective:  Patient ID: Jessica Dawson, female    DOB: 1945/03/24  Age: 74 y.o. MRN: [id number]  CC:  Chief Complaint  Patient presents with  . Hypertension  . Hypothyroidism    HPI Jessica Dawson presents for 6 mo f/u  Hypertension- Pt denies chest pain, SOB, dizziness, or heart palpitations.  Taking meds as directed w/o problems.  Denies medication side effects.    Hypothyroidism - Taking medication regularly in the AM away from food and vitamins, etc. No recent change to skin, hair, or energy levels.  Hyperlipidemia-she said that the statin that I sent in, but have a statin was too expensive it was good to be about $200 so she never picked it up.  He also wanted to let me know that she is considering back surgery more recently her left leg started really hurting and she has had some balance issues and has started to use her cane again.  They tried to do an injection but she says it did not provide any relief so her only option at this point is back surgery she wants to know my opinion in regards to if she thinks that she is a good candidate or not.   Past Medical History:  Diagnosis Date  . GERD (gastroesophageal reflux disease)   . Hiatal hernia   . Hyperlipidemia   . Hypertension   . Hypothyroid   . SVT (supraventricular tachycardia) (HCC)     Past Surgical History:  Procedure Laterality Date  . ABLATION    . CATARACT EXTRACTION    . lapinectomy  March 6th, 2020    Family History  Problem Relation Age of Onset  . Heart disease Father        MI at age 5  . Hyperlipidemia Sister   . Hypertension Sister     Social History   Socioeconomic History  . Marital status: Married    Spouse name: Fayrene Fearing  . Number of children: 3  . Years of education: 10  . Highest education level: 10th grade  Occupational History  . Occupation: babysitter    Comment: retired  Tobacco Use  . Smoking status: Former Games developer  . Smokeless tobacco:  Never Used  Vaping Use  . Vaping Use: Never used  Substance and Sexual Activity  . Alcohol use: No  . Drug use: No  . Sexual activity: Not Currently  Other Topics Concern  . Not on file  Social History Narrative   Recently had back surgery.   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   Cuts grass and cleans home   Social Determinants of Health   Financial Resource Strain:   . Difficulty of Paying Living Expenses: Not on file  Food Insecurity:   . Worried About Programme researcher, broadcasting/film/video in the Last Year: Not on file  . Ran Out of Food in the Last Year: Not on file  Transportation Needs:   . Lack of Transportation (Medical): Not on file  . Lack of Transportation (Non-Medical): Not on file  Physical Activity:   . Days of Exercise per Week: Not on file  . Minutes of Exercise per Session: Not on file  Stress:   . Feeling of Stress : Not on file  Social Connections:   . Frequency of Communication with Friends and Family: Not on file  . Frequency of Social Gatherings with Friends and Family: Not on file  . Attends Religious Services: Not on file  .  Active Member of Clubs or Organizations: Not on file  . Attends Banker Meetings: Not on file  . Marital Status: Not on file  Intimate Partner Violence:   . Fear of Current or Ex-Partner: Not on file  . Emotionally Abused: Not on file  . Physically Abused: Not on file  . Sexually Abused: Not on file    Outpatient Medications Prior to Visit  Medication Sig Dispense Refill  . Calcium-Vitamin D (CALTRATE 600 PLUS-VIT D PO) Take by mouth.      . gabapentin (NEURONTIN) 300 MG capsule Take 1 capsule by mouth at bedtime 90 capsule 0  . levothyroxine (SYNTHROID) 75 MCG tablet Take 1 tablet by mouth once daily 30 tablet 0  . Multiple Vitamins-Minerals (CENTRUM SILVER PO) Take by mouth.      . naproxen (NAPROSYN) 500 MG tablet TAKE 1 TABLET BY MOUTH TWICE DAILY WITH MEALS 180 tablet 0  . omeprazole (PRILOSEC) 40 MG capsule Take 1 capsule by  mouth once daily 90 capsule 3  . telmisartan (MICARDIS) 80 MG tablet Take 1 tablet by mouth once daily 90 tablet 0  . aspirin 81 MG tablet Take 81 mg by mouth daily.      . Pitavastatin Calcium 1 MG TABS Take 1 tablet (1 mg total) by mouth 2 (two) times a week. 24 tablet 3   No facility-administered medications prior to visit.    Allergies  Allergen Reactions  . Crestor [Rosuvastatin] Other (See Comments)    Joint aches   . Lipitor [Atorvastatin] Other (See Comments)    Muscle cramps   . Livalo [Pitavastatin] Other (See Comments)    constipation  . Pravastatin Sodium Other (See Comments)    REACTION: Myalgias    ROS Review of Systems    Objective:    Physical Exam Constitutional:      Appearance: She is well-developed.  HENT:     Head: Normocephalic and atraumatic.  Cardiovascular:     Rate and Rhythm: Normal rate and regular rhythm.     Heart sounds: Normal heart sounds.  Pulmonary:     Effort: Pulmonary effort is normal.     Breath sounds: Normal breath sounds.  Skin:    General: Skin is warm and dry.  Neurological:     Mental Status: She is alert and oriented to person, place, and time.  Psychiatric:        Behavior: Behavior normal.     BP (!) 141/62   Pulse 76   Ht 4\' 10"  (1.473 m)   Wt 109 lb (49.4 kg)   SpO2 100%   BMI 22.78 kg/m  Wt Readings from Last 3 Encounters:  01/06/20 109 lb (49.4 kg)  08/05/19 113 lb (51.3 kg)  07/07/19 115 lb (52.2 kg)     There are no preventive care reminders to display for this patient.  There are no preventive care reminders to display for this patient.  Lab Results  Component Value Date   TSH 1.91 05/20/2019   Lab Results  Component Value Date   WBC 6.8 09/02/2012   HGB 12.7 09/02/2012   HCT 38.3 09/02/2012   MCV 89.1 09/02/2012   PLT 354 09/02/2012   Lab Results  Component Value Date   NA 139 08/14/2019   K 4.9 08/14/2019   CO2 24 08/14/2019   GLUCOSE 83 08/14/2019   BUN 28 (H) 08/14/2019    CREATININE 1.07 (H) 08/14/2019   BILITOT 0.5 09/02/2018   ALKPHOS 60 07/24/2016  AST 22 09/02/2018   ALT 16 09/02/2018   PROT 7.3 09/02/2018   ALBUMIN 4.4 07/24/2016   CALCIUM 9.3 08/14/2019   Lab Results  Component Value Date   CHOL 294 (H) 01/06/2019   Lab Results  Component Value Date   HDL 49 (L) 01/06/2019   Lab Results  Component Value Date   LDLCALC 213 (H) 01/06/2019   Lab Results  Component Value Date   TRIG 159 (H) 01/06/2019   Lab Results  Component Value Date   CHOLHDL 6.0 (H) 01/06/2019   No results found for: HGBA1C    Assessment & Plan:   Problem List Items Addressed This Visit      Cardiovascular and Mediastinum   ESSENTIAL HYPERTENSION, BENIGN    Blood pressure not at goal today.  It was elevated the last time she was here as well.  Continue with ARB and will add 12.5 mg of hydrochlorothiazide.  New prescription sent to pharmacy.  Sugar for labs in about 2 weeks.      Relevant Medications   simvastatin (ZOCOR) 20 MG tablet   hydrochlorothiazide (MICROZIDE) 12.5 MG capsule   Other Relevant Orders   Lipid Panel w/reflex Direct LDL     Endocrine   Hypothyroidism    Taking medications regularly.  Due to recheck TSH.      Relevant Orders   TSH     Genitourinary   CKD (chronic kidney disease) stage 3, GFR 30-59 ml/min (HCC)    Following renal function every 6 months.      Relevant Orders   Lipid Panel w/reflex Direct LDL   COMPLETE METABOLIC PANEL WITH GFR     Other   Hyperlipidemia    She is willing to try a different statin even though she is had multiple intolerances.  Pitavistatin was too costly.  We will send over new prescription for simvastatin.  I want her to try it twice a week.  And then go for labs in about 2 weeks.      Relevant Medications   simvastatin (ZOCOR) 20 MG tablet   hydrochlorothiazide (MICROZIDE) 12.5 MG capsule    Other Visit Diagnoses    Need for immunization against influenza    -  Primary   Relevant  Orders   Flu Vaccine QUAD High Dose(Fluad) (Completed)      Meds ordered this encounter  Medications  . simvastatin (ZOCOR) 20 MG tablet    Sig: Take one by mouth twice a week at bedtime    Dispense:  24 tablet    Refill:  3  . hydrochlorothiazide (MICROZIDE) 12.5 MG capsule    Sig: Take 1 capsule (12.5 mg total) by mouth daily.    Dispense:  90 capsule    Refill:  1    Follow-up: Return in about 6 months (around 07/06/2020) for Hypertension adn thyroid.    [name], MD

## 2020-01-06 NOTE — Assessment & Plan Note (Signed)
Blood pressure not at goal today.  It was elevated the last time she was here as well.  Continue with ARB and will add 12.5 mg of hydrochlorothiazide.  New prescription sent to pharmacy.  Sugar for labs in about 2 weeks.

## 2020-01-06 NOTE — Assessment & Plan Note (Addendum)
She is willing to try a different statin even though she is had multiple intolerances.  Pitavistatin was too costly.  We will send over new prescription for simvastatin.  I want her to try it twice a week.  And then go for labs in about 2 weeks.

## 2020-01-27 ENCOUNTER — Telehealth: Payer: Self-pay

## 2020-01-27 NOTE — Telephone Encounter (Signed)
Patient called with updated blood pressures after starting HCTZ.   She started after visit and blood pressure dropped to 98/52, 91/55, 80/52, 81/52, and 87/52, and 105/67.   Patient stopped diuretic on Thursday (01/22/20) and blood pressures since have been 120/62 and 111/57. She states top number is now staying in the 120s.   Reports weight stayed about the same, she just fluctuated between 109 and 107 lbs.   She states she feels really good off off diuretic and does not plan to restart.

## 2020-01-27 NOTE — Telephone Encounter (Signed)
Okay, that sounds good.  Just continue to monitor and hold the hydrochlorothiazide.

## 2020-01-29 NOTE — Telephone Encounter (Signed)
Left pt msg advising of recommendations

## 2020-02-04 DIAGNOSIS — M5117 Intervertebral disc disorders with radiculopathy, lumbosacral region: Secondary | ICD-10-CM | POA: Diagnosis not present

## 2020-02-04 DIAGNOSIS — M5416 Radiculopathy, lumbar region: Secondary | ICD-10-CM | POA: Diagnosis not present

## 2020-02-04 DIAGNOSIS — M4726 Other spondylosis with radiculopathy, lumbar region: Secondary | ICD-10-CM | POA: Diagnosis not present

## 2020-02-04 DIAGNOSIS — M5116 Intervertebral disc disorders with radiculopathy, lumbar region: Secondary | ICD-10-CM | POA: Diagnosis not present

## 2020-02-04 DIAGNOSIS — M4727 Other spondylosis with radiculopathy, lumbosacral region: Secondary | ICD-10-CM | POA: Diagnosis not present

## 2020-02-23 ENCOUNTER — Other Ambulatory Visit: Payer: Self-pay | Admitting: Family Medicine

## 2020-02-23 DIAGNOSIS — E039 Hypothyroidism, unspecified: Secondary | ICD-10-CM | POA: Diagnosis not present

## 2020-02-23 DIAGNOSIS — I10 Essential (primary) hypertension: Secondary | ICD-10-CM | POA: Diagnosis not present

## 2020-02-23 DIAGNOSIS — N1831 Chronic kidney disease, stage 3a: Secondary | ICD-10-CM | POA: Diagnosis not present

## 2020-02-24 LAB — COMPLETE METABOLIC PANEL WITH GFR
AG Ratio: 1.5 (calc) (ref 1.0–2.5)
ALT: 19 U/L (ref 6–29)
AST: 24 U/L (ref 10–35)
Albumin: 4.3 g/dL (ref 3.6–5.1)
Alkaline phosphatase (APISO): 65 U/L (ref 37–153)
BUN/Creatinine Ratio: 26 (calc) — ABNORMAL HIGH (ref 6–22)
BUN: 31 mg/dL — ABNORMAL HIGH (ref 7–25)
CO2: 21 mmol/L (ref 20–32)
Calcium: 9.7 mg/dL (ref 8.6–10.4)
Chloride: 106 mmol/L (ref 98–110)
Creat: 1.21 mg/dL — ABNORMAL HIGH (ref 0.60–0.93)
GFR, Est African American: 50 mL/min/{1.73_m2} — ABNORMAL LOW (ref >=60)
GFR, Est Non African American: 43 mL/min/{1.73_m2} — ABNORMAL LOW (ref >=60)
Globulin: 2.8 g/dL (calc) (ref 1.9–3.7)
Glucose, Bld: 88 mg/dL (ref 65–99)
Potassium: 5.3 mmol/L (ref 3.5–5.3)
Sodium: 138 mmol/L (ref 135–146)
Total Bilirubin: 0.4 mg/dL (ref 0.2–1.2)
Total Protein: 7.1 g/dL (ref 6.1–8.1)

## 2020-02-24 LAB — LIPID PANEL W/REFLEX DIRECT LDL
Cholesterol: 274 mg/dL — ABNORMAL HIGH (ref <200)
HDL: 56 mg/dL (ref >=50)
LDL Cholesterol (Calc): 187 mg/dL (calc) — ABNORMAL HIGH
Non-HDL Cholesterol (Calc): 218 mg/dL (calc) — ABNORMAL HIGH (ref <130)
Total CHOL/HDL Ratio: 4.9 (calc) (ref <5.0)
Triglycerides: 160 mg/dL — ABNORMAL HIGH (ref <150)

## 2020-02-24 LAB — TSH: TSH: 5.19 mIU/L — ABNORMAL HIGH (ref 0.40–4.50)

## 2020-02-25 DIAGNOSIS — R69 Illness, unspecified: Secondary | ICD-10-CM | POA: Diagnosis not present

## 2020-03-11 DIAGNOSIS — N189 Chronic kidney disease, unspecified: Secondary | ICD-10-CM | POA: Diagnosis not present

## 2020-03-11 DIAGNOSIS — Z01810 Encounter for preprocedural cardiovascular examination: Secondary | ICD-10-CM | POA: Diagnosis not present

## 2020-03-11 DIAGNOSIS — Z01818 Encounter for other preprocedural examination: Secondary | ICD-10-CM | POA: Diagnosis not present

## 2020-03-11 DIAGNOSIS — M5416 Radiculopathy, lumbar region: Secondary | ICD-10-CM | POA: Diagnosis not present

## 2020-03-11 DIAGNOSIS — I129 Hypertensive chronic kidney disease with stage 1 through stage 4 chronic kidney disease, or unspecified chronic kidney disease: Secondary | ICD-10-CM | POA: Diagnosis not present

## 2020-03-11 DIAGNOSIS — Z01812 Encounter for preprocedural laboratory examination: Secondary | ICD-10-CM | POA: Diagnosis not present

## 2020-03-17 ENCOUNTER — Other Ambulatory Visit: Payer: Self-pay | Admitting: Family Medicine

## 2020-03-17 DIAGNOSIS — E039 Hypothyroidism, unspecified: Secondary | ICD-10-CM

## 2020-03-24 ENCOUNTER — Other Ambulatory Visit: Payer: Self-pay

## 2020-03-24 DIAGNOSIS — E039 Hypothyroidism, unspecified: Secondary | ICD-10-CM

## 2020-03-24 MED ORDER — LEVOTHYROXINE SODIUM 75 MCG PO TABS: 75.0000 ug | ORAL_TABLET | Freq: Every day | ORAL | 0 refills | Status: DC

## 2020-04-13 ENCOUNTER — Other Ambulatory Visit: Payer: Self-pay | Admitting: Family Medicine

## 2020-04-15 DIAGNOSIS — E039 Hypothyroidism, unspecified: Secondary | ICD-10-CM | POA: Diagnosis not present

## 2020-04-16 ENCOUNTER — Other Ambulatory Visit: Payer: Self-pay | Admitting: Family Medicine

## 2020-04-16 DIAGNOSIS — E039 Hypothyroidism, unspecified: Secondary | ICD-10-CM

## 2020-04-16 LAB — TSH: TSH: 6.07 mIU/L — ABNORMAL HIGH (ref 0.40–4.50)

## 2020-04-16 MED ORDER — LEVOTHYROXINE SODIUM 88 MCG PO TABS: 88.0000 ug | ORAL_TABLET | Freq: Every day | ORAL | 1 refills | Status: DC

## 2020-04-19 DIAGNOSIS — Z01812 Encounter for preprocedural laboratory examination: Secondary | ICD-10-CM | POA: Diagnosis not present

## 2020-04-19 DIAGNOSIS — Z20822 Contact with and (suspected) exposure to covid-19: Secondary | ICD-10-CM | POA: Diagnosis not present

## 2020-04-21 ENCOUNTER — Other Ambulatory Visit: Payer: Self-pay | Admitting: *Deleted

## 2020-04-21 DIAGNOSIS — E039 Hypothyroidism, unspecified: Secondary | ICD-10-CM

## 2020-04-22 DIAGNOSIS — N183 Chronic kidney disease, stage 3 unspecified: Secondary | ICD-10-CM | POA: Diagnosis not present

## 2020-04-22 DIAGNOSIS — Z7982 Long term (current) use of aspirin: Secondary | ICD-10-CM | POA: Diagnosis not present

## 2020-04-22 DIAGNOSIS — E039 Hypothyroidism, unspecified: Secondary | ICD-10-CM | POA: Diagnosis not present

## 2020-04-22 DIAGNOSIS — K219 Gastro-esophageal reflux disease without esophagitis: Secondary | ICD-10-CM | POA: Diagnosis not present

## 2020-04-22 DIAGNOSIS — N189 Chronic kidney disease, unspecified: Secondary | ICD-10-CM | POA: Diagnosis not present

## 2020-04-22 DIAGNOSIS — M5416 Radiculopathy, lumbar region: Secondary | ICD-10-CM | POA: Diagnosis not present

## 2020-04-22 DIAGNOSIS — M48062 Spinal stenosis, lumbar region with neurogenic claudication: Secondary | ICD-10-CM | POA: Diagnosis not present

## 2020-04-22 DIAGNOSIS — M4316 Spondylolisthesis, lumbar region: Secondary | ICD-10-CM | POA: Diagnosis not present

## 2020-04-22 DIAGNOSIS — I129 Hypertensive chronic kidney disease with stage 1 through stage 4 chronic kidney disease, or unspecified chronic kidney disease: Secondary | ICD-10-CM | POA: Diagnosis not present

## 2020-04-22 DIAGNOSIS — G96198 Other disorders of meninges, not elsewhere classified: Secondary | ICD-10-CM | POA: Diagnosis not present

## 2020-04-23 DIAGNOSIS — M4316 Spondylolisthesis, lumbar region: Secondary | ICD-10-CM | POA: Diagnosis not present

## 2020-04-23 DIAGNOSIS — M5416 Radiculopathy, lumbar region: Secondary | ICD-10-CM | POA: Diagnosis not present

## 2020-04-23 DIAGNOSIS — M48062 Spinal stenosis, lumbar region with neurogenic claudication: Secondary | ICD-10-CM | POA: Diagnosis not present

## 2020-04-26 ENCOUNTER — Telehealth: Payer: Medicare HMO | Admitting: *Deleted

## 2020-04-26 NOTE — Telephone Encounter (Signed)
Crystal from [facility] is requesting VO for PT/OT. Okay for verbal?

## 2020-04-26 NOTE — Telephone Encounter (Signed)
OK for PT and OT

## 2020-04-27 DIAGNOSIS — Z98891 History of uterine scar from previous surgery: Secondary | ICD-10-CM | POA: Diagnosis not present

## 2020-04-27 DIAGNOSIS — M48062 Spinal stenosis, lumbar region with neurogenic claudication: Secondary | ICD-10-CM | POA: Diagnosis not present

## 2020-04-27 DIAGNOSIS — M4316 Spondylolisthesis, lumbar region: Secondary | ICD-10-CM | POA: Diagnosis not present

## 2020-04-27 DIAGNOSIS — I129 Hypertensive chronic kidney disease with stage 1 through stage 4 chronic kidney disease, or unspecified chronic kidney disease: Secondary | ICD-10-CM | POA: Diagnosis not present

## 2020-04-27 DIAGNOSIS — Z9181 History of falling: Secondary | ICD-10-CM | POA: Diagnosis not present

## 2020-04-27 DIAGNOSIS — K219 Gastro-esophageal reflux disease without esophagitis: Secondary | ICD-10-CM | POA: Diagnosis not present

## 2020-04-27 DIAGNOSIS — Z87891 Personal history of nicotine dependence: Secondary | ICD-10-CM | POA: Diagnosis not present

## 2020-04-27 DIAGNOSIS — M4726 Other spondylosis with radiculopathy, lumbar region: Secondary | ICD-10-CM | POA: Diagnosis not present

## 2020-04-27 DIAGNOSIS — M5136 Other intervertebral disc degeneration, lumbar region: Secondary | ICD-10-CM | POA: Diagnosis not present

## 2020-04-27 DIAGNOSIS — N189 Chronic kidney disease, unspecified: Secondary | ICD-10-CM | POA: Diagnosis not present

## 2020-04-27 DIAGNOSIS — H539 Unspecified visual disturbance: Secondary | ICD-10-CM | POA: Diagnosis not present

## 2020-04-27 DIAGNOSIS — E079 Disorder of thyroid, unspecified: Secondary | ICD-10-CM | POA: Diagnosis not present

## 2020-04-27 DIAGNOSIS — Z981 Arthrodesis status: Secondary | ICD-10-CM | POA: Diagnosis not present

## 2020-04-27 DIAGNOSIS — M1990 Unspecified osteoarthritis, unspecified site: Secondary | ICD-10-CM | POA: Diagnosis not present

## 2020-04-27 DIAGNOSIS — Z4789 Encounter for other orthopedic aftercare: Secondary | ICD-10-CM | POA: Diagnosis not present

## 2020-04-28 ENCOUNTER — Telehealth: Payer: Self-pay | Admitting: Family Medicine

## 2020-04-28 NOTE — Telephone Encounter (Signed)
Crystal at [facility] left a voicemail stating that she needs signed orders or Verbal orders for this patients Pt/OT and you can call her back at [phone]

## 2020-04-28 NOTE — Telephone Encounter (Signed)
LVM giving verbal for PT/OT and left fax # also.

## 2020-04-28 NOTE — Telephone Encounter (Signed)
Occ Therapist called from [facility] and states that he needs to go over some orders and if someone could call him back at [phone]

## 2020-04-29 NOTE — Telephone Encounter (Signed)
Called and spoke w/Jessica Dawson to let her know that these orders will need to come from the surgeon.   Faxed over d/c summary

## 2020-05-06 DIAGNOSIS — M5136 Other intervertebral disc degeneration, lumbar region: Secondary | ICD-10-CM | POA: Diagnosis not present

## 2020-05-06 DIAGNOSIS — M4316 Spondylolisthesis, lumbar region: Secondary | ICD-10-CM | POA: Diagnosis not present

## 2020-05-06 DIAGNOSIS — Z981 Arthrodesis status: Secondary | ICD-10-CM | POA: Diagnosis not present

## 2020-05-06 DIAGNOSIS — R0602 Shortness of breath: Secondary | ICD-10-CM | POA: Insufficient documentation

## 2020-05-06 DIAGNOSIS — M47816 Spondylosis without myelopathy or radiculopathy, lumbar region: Secondary | ICD-10-CM | POA: Diagnosis not present

## 2020-05-28 DIAGNOSIS — Z87891 Personal history of nicotine dependence: Secondary | ICD-10-CM | POA: Diagnosis not present

## 2020-05-28 DIAGNOSIS — I129 Hypertensive chronic kidney disease with stage 1 through stage 4 chronic kidney disease, or unspecified chronic kidney disease: Secondary | ICD-10-CM | POA: Diagnosis not present

## 2020-05-28 DIAGNOSIS — M4316 Spondylolisthesis, lumbar region: Secondary | ICD-10-CM | POA: Diagnosis not present

## 2020-05-28 DIAGNOSIS — M4726 Other spondylosis with radiculopathy, lumbar region: Secondary | ICD-10-CM | POA: Diagnosis not present

## 2020-05-28 DIAGNOSIS — M5136 Other intervertebral disc degeneration, lumbar region: Secondary | ICD-10-CM | POA: Diagnosis not present

## 2020-05-28 DIAGNOSIS — M1990 Unspecified osteoarthritis, unspecified site: Secondary | ICD-10-CM | POA: Diagnosis not present

## 2020-05-28 DIAGNOSIS — K219 Gastro-esophageal reflux disease without esophagitis: Secondary | ICD-10-CM | POA: Diagnosis not present

## 2020-05-28 DIAGNOSIS — Z981 Arthrodesis status: Secondary | ICD-10-CM | POA: Diagnosis not present

## 2020-05-28 DIAGNOSIS — E079 Disorder of thyroid, unspecified: Secondary | ICD-10-CM | POA: Diagnosis not present

## 2020-05-28 DIAGNOSIS — Z4789 Encounter for other orthopedic aftercare: Secondary | ICD-10-CM | POA: Diagnosis not present

## 2020-05-28 DIAGNOSIS — M48062 Spinal stenosis, lumbar region with neurogenic claudication: Secondary | ICD-10-CM | POA: Diagnosis not present

## 2020-05-28 DIAGNOSIS — Z98891 History of uterine scar from previous surgery: Secondary | ICD-10-CM | POA: Diagnosis not present

## 2020-05-28 DIAGNOSIS — N189 Chronic kidney disease, unspecified: Secondary | ICD-10-CM | POA: Diagnosis not present

## 2020-05-28 DIAGNOSIS — H539 Unspecified visual disturbance: Secondary | ICD-10-CM | POA: Diagnosis not present

## 2020-05-28 DIAGNOSIS — Z9181 History of falling: Secondary | ICD-10-CM | POA: Diagnosis not present

## 2020-06-04 DIAGNOSIS — M5136 Other intervertebral disc degeneration, lumbar region: Secondary | ICD-10-CM | POA: Diagnosis not present

## 2020-06-04 DIAGNOSIS — H539 Unspecified visual disturbance: Secondary | ICD-10-CM | POA: Diagnosis not present

## 2020-06-04 DIAGNOSIS — Z9181 History of falling: Secondary | ICD-10-CM | POA: Diagnosis not present

## 2020-06-04 DIAGNOSIS — M4726 Other spondylosis with radiculopathy, lumbar region: Secondary | ICD-10-CM | POA: Diagnosis not present

## 2020-06-04 DIAGNOSIS — E079 Disorder of thyroid, unspecified: Secondary | ICD-10-CM | POA: Diagnosis not present

## 2020-06-04 DIAGNOSIS — Z981 Arthrodesis status: Secondary | ICD-10-CM | POA: Diagnosis not present

## 2020-06-04 DIAGNOSIS — K219 Gastro-esophageal reflux disease without esophagitis: Secondary | ICD-10-CM | POA: Diagnosis not present

## 2020-06-04 DIAGNOSIS — M4316 Spondylolisthesis, lumbar region: Secondary | ICD-10-CM | POA: Diagnosis not present

## 2020-06-04 DIAGNOSIS — Z98891 History of uterine scar from previous surgery: Secondary | ICD-10-CM | POA: Diagnosis not present

## 2020-06-04 DIAGNOSIS — I129 Hypertensive chronic kidney disease with stage 1 through stage 4 chronic kidney disease, or unspecified chronic kidney disease: Secondary | ICD-10-CM | POA: Diagnosis not present

## 2020-06-04 DIAGNOSIS — Z87891 Personal history of nicotine dependence: Secondary | ICD-10-CM | POA: Diagnosis not present

## 2020-06-04 DIAGNOSIS — M1990 Unspecified osteoarthritis, unspecified site: Secondary | ICD-10-CM | POA: Diagnosis not present

## 2020-06-04 DIAGNOSIS — N189 Chronic kidney disease, unspecified: Secondary | ICD-10-CM | POA: Diagnosis not present

## 2020-06-04 DIAGNOSIS — Z4789 Encounter for other orthopedic aftercare: Secondary | ICD-10-CM | POA: Diagnosis not present

## 2020-06-04 DIAGNOSIS — M48062 Spinal stenosis, lumbar region with neurogenic claudication: Secondary | ICD-10-CM | POA: Diagnosis not present

## 2020-06-09 DIAGNOSIS — M5136 Other intervertebral disc degeneration, lumbar region: Secondary | ICD-10-CM | POA: Diagnosis not present

## 2020-06-09 DIAGNOSIS — M4726 Other spondylosis with radiculopathy, lumbar region: Secondary | ICD-10-CM | POA: Diagnosis not present

## 2020-06-09 DIAGNOSIS — M1990 Unspecified osteoarthritis, unspecified site: Secondary | ICD-10-CM | POA: Diagnosis not present

## 2020-06-09 DIAGNOSIS — M48062 Spinal stenosis, lumbar region with neurogenic claudication: Secondary | ICD-10-CM | POA: Diagnosis not present

## 2020-06-09 DIAGNOSIS — K219 Gastro-esophageal reflux disease without esophagitis: Secondary | ICD-10-CM | POA: Diagnosis not present

## 2020-06-09 DIAGNOSIS — Z9181 History of falling: Secondary | ICD-10-CM | POA: Diagnosis not present

## 2020-06-09 DIAGNOSIS — I129 Hypertensive chronic kidney disease with stage 1 through stage 4 chronic kidney disease, or unspecified chronic kidney disease: Secondary | ICD-10-CM | POA: Diagnosis not present

## 2020-06-09 DIAGNOSIS — N189 Chronic kidney disease, unspecified: Secondary | ICD-10-CM | POA: Diagnosis not present

## 2020-06-09 DIAGNOSIS — Z87891 Personal history of nicotine dependence: Secondary | ICD-10-CM | POA: Diagnosis not present

## 2020-06-09 DIAGNOSIS — Z981 Arthrodesis status: Secondary | ICD-10-CM | POA: Diagnosis not present

## 2020-06-09 DIAGNOSIS — H539 Unspecified visual disturbance: Secondary | ICD-10-CM | POA: Diagnosis not present

## 2020-06-09 DIAGNOSIS — E079 Disorder of thyroid, unspecified: Secondary | ICD-10-CM | POA: Diagnosis not present

## 2020-06-09 DIAGNOSIS — Z98891 History of uterine scar from previous surgery: Secondary | ICD-10-CM | POA: Diagnosis not present

## 2020-06-09 DIAGNOSIS — M4316 Spondylolisthesis, lumbar region: Secondary | ICD-10-CM | POA: Diagnosis not present

## 2020-06-09 DIAGNOSIS — Z4789 Encounter for other orthopedic aftercare: Secondary | ICD-10-CM | POA: Diagnosis not present

## 2020-06-15 DIAGNOSIS — M4726 Other spondylosis with radiculopathy, lumbar region: Secondary | ICD-10-CM | POA: Diagnosis not present

## 2020-06-15 DIAGNOSIS — M5136 Other intervertebral disc degeneration, lumbar region: Secondary | ICD-10-CM | POA: Diagnosis not present

## 2020-06-15 DIAGNOSIS — N189 Chronic kidney disease, unspecified: Secondary | ICD-10-CM | POA: Diagnosis not present

## 2020-06-15 DIAGNOSIS — M4316 Spondylolisthesis, lumbar region: Secondary | ICD-10-CM | POA: Diagnosis not present

## 2020-06-15 DIAGNOSIS — I129 Hypertensive chronic kidney disease with stage 1 through stage 4 chronic kidney disease, or unspecified chronic kidney disease: Secondary | ICD-10-CM | POA: Diagnosis not present

## 2020-06-15 DIAGNOSIS — Z87891 Personal history of nicotine dependence: Secondary | ICD-10-CM | POA: Diagnosis not present

## 2020-06-15 DIAGNOSIS — M1990 Unspecified osteoarthritis, unspecified site: Secondary | ICD-10-CM | POA: Diagnosis not present

## 2020-06-15 DIAGNOSIS — E079 Disorder of thyroid, unspecified: Secondary | ICD-10-CM | POA: Diagnosis not present

## 2020-06-15 DIAGNOSIS — Z981 Arthrodesis status: Secondary | ICD-10-CM | POA: Diagnosis not present

## 2020-06-15 DIAGNOSIS — Z9181 History of falling: Secondary | ICD-10-CM | POA: Diagnosis not present

## 2020-06-15 DIAGNOSIS — Z98891 History of uterine scar from previous surgery: Secondary | ICD-10-CM | POA: Diagnosis not present

## 2020-06-15 DIAGNOSIS — M48062 Spinal stenosis, lumbar region with neurogenic claudication: Secondary | ICD-10-CM | POA: Diagnosis not present

## 2020-06-15 DIAGNOSIS — H539 Unspecified visual disturbance: Secondary | ICD-10-CM | POA: Diagnosis not present

## 2020-06-15 DIAGNOSIS — K219 Gastro-esophageal reflux disease without esophagitis: Secondary | ICD-10-CM | POA: Diagnosis not present

## 2020-06-15 DIAGNOSIS — Z4789 Encounter for other orthopedic aftercare: Secondary | ICD-10-CM | POA: Diagnosis not present

## 2020-07-06 ENCOUNTER — Ambulatory Visit (INDEPENDENT_AMBULATORY_CARE_PROVIDER_SITE_OTHER): Payer: Medicare HMO | Admitting: Family Medicine

## 2020-07-06 ENCOUNTER — Other Ambulatory Visit: Payer: Self-pay

## 2020-07-06 ENCOUNTER — Encounter: Payer: Self-pay | Admitting: Family Medicine

## 2020-07-06 VITALS — BP 130/72 | HR 72 | Ht <= 58 in | Wt 116.0 lb

## 2020-07-06 DIAGNOSIS — I10 Essential (primary) hypertension: Secondary | ICD-10-CM

## 2020-07-06 DIAGNOSIS — E039 Hypothyroidism, unspecified: Secondary | ICD-10-CM

## 2020-07-06 DIAGNOSIS — N1831 Chronic kidney disease, stage 3a: Secondary | ICD-10-CM | POA: Diagnosis not present

## 2020-07-06 LAB — BASIC METABOLIC PANEL WITH GFR
BUN/Creatinine Ratio: 21 (calc) (ref 6–22)
BUN: 25 mg/dL (ref 7–25)
CO2: 27 mmol/L (ref 20–32)
Calcium: 9.8 mg/dL (ref 8.6–10.4)
Chloride: 105 mmol/L (ref 98–110)
Creat: 1.19 mg/dL — ABNORMAL HIGH (ref 0.60–0.93)
GFR, Est African American: 51 mL/min/{1.73_m2} — ABNORMAL LOW (ref >=60)
GFR, Est Non African American: 44 mL/min/{1.73_m2} — ABNORMAL LOW (ref >=60)
Glucose, Bld: 92 mg/dL (ref 65–99)
Potassium: 4.5 mmol/L (ref 3.5–5.3)
Sodium: 141 mmol/L (ref 135–146)

## 2020-07-06 LAB — TSH: TSH: 0.4 mIU/L (ref 0.40–4.50)

## 2020-07-06 MED ORDER — AMBULATORY NON FORMULARY MEDICATION: 0 refills | Status: DC

## 2020-07-06 NOTE — Assessment & Plan Note (Addendum)
Doing well on  88 mcg well.  Due to recheck TSH.

## 2020-07-06 NOTE — Assessment & Plan Note (Signed)
Recheck Q 83months.

## 2020-07-06 NOTE — Progress Notes (Signed)
Established Patient Office Visit  Subjective:  Patient ID: Jessica Dawson, female    DOB: 08/07/1945  Age: 75 y.o. MRN: [id number]  CC:  Chief Complaint  Patient presents with  . Annual Exam    HPI Jessica Dawson presents for   Hypertension- Pt denies chest pain, SOB, dizziness, or heart palpitations.  Taking meds as directed w/o problems.  Denies medication side effects.    F/U CKD - no recent changes.   Hypothyroidism - Taking medication regularly in the AM away from food and vitamins, etc. No recent change to skin, hair, or energy levels.  Last TSH was elevated at 6.  We increased her medication new prescription was sent for 88 mcg to take 1 daily and the plan is to recheck that today.  She really has not noticed a difference in how she feels.  But she otherwise feels well.  She recently underwent bilateral lumbar laminectomies with posterior/transforaminal interbody fusion at L4-5.  She also unfortunately had a fall after her surgery but is doing better.  She still on limitations but again she is doing well.  He is now walking twice a day.  Past Medical History:  Diagnosis Date  . GERD (gastroesophageal reflux disease)   . Hiatal hernia   . Hyperlipidemia   . Hypertension   . Hypothyroid   . SVT (supraventricular tachycardia) (HCC)     Past Surgical History:  Procedure Laterality Date  . ABLATION    . CATARACT EXTRACTION    . lapinectomy  March 6th, 2020    Family History  Problem Relation Age of Onset  . Heart disease Father        MI at age 28  . Hyperlipidemia Sister   . Hypertension Sister     Social History   Socioeconomic History  . Marital status: Married    Spouse name: Fayrene Fearing  . Number of children: 3  . Years of education: 10  . Highest education level: 10th grade  Occupational History  . Occupation: babysitter    Comment: retired  Tobacco Use  . Smoking status: Former Games developer  . Smokeless tobacco: Never Used  Vaping Use  . Vaping Use: Never  used  Substance and Sexual Activity  . Alcohol use: No  . Drug use: No  . Sexual activity: Not Currently  Other Topics Concern  . Not on file  Social History Narrative   Recently had back surgery.   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   Cuts grass and cleans home   Social Determinants of Health   Financial Resource Strain: Not on file  Food Insecurity: Not on file  Transportation Needs: Not on file  Physical Activity: Not on file  Stress: Not on file  Social Connections: Not on file  Intimate Partner Violence: Not on file    Outpatient Medications Prior to Visit  Medication Sig Dispense Refill  . gabapentin (NEURONTIN) 300 MG capsule Take 1 capsule by mouth at bedtime 90 capsule 0  . levothyroxine (EUTHYROX) 88 MCG tablet Take 1 tablet (88 mcg total) by mouth daily before breakfast. 30 tablet 1  . omeprazole (PRILOSEC) 40 MG capsule Take 1 capsule by mouth once daily 90 capsule 3  . telmisartan (MICARDIS) 80 MG tablet TAKE 1 TABLET BY MOUTH ONCE DAILY . APPOINTMENT REQUIRED FOR FUTURE REFILLS 90 tablet 0  . Calcium-Vitamin D (CALTRATE 600 PLUS-VIT D PO) Take by mouth.    . hydrochlorothiazide (MICROZIDE) 12.5 MG capsule Take 1 capsule (  12.5 mg total) by mouth daily. 90 capsule 1  . Multiple Vitamins-Minerals (CENTRUM SILVER PO) Take by mouth.      . naproxen (NAPROSYN) 500 MG tablet TAKE 1 TABLET BY MOUTH TWICE DAILY WITH MEALS 180 tablet 0  . simvastatin (ZOCOR) 20 MG tablet Take one by mouth twice a week at bedtime 24 tablet 3   No facility-administered medications prior to visit.    Allergies  Allergen Reactions  . Crestor [Rosuvastatin] Other (See Comments)    Joint aches   . Lipitor [Atorvastatin] Other (See Comments)    Muscle cramps   . Livalo [Pitavastatin] Other (See Comments)    constipation  . Pravastatin Sodium Other (See Comments)    REACTION: Myalgias    ROS Review of Systems    Objective:    Physical Exam Constitutional:      Appearance: She is  well-developed.  HENT:     Head: Normocephalic and atraumatic.  Cardiovascular:     Rate and Rhythm: Normal rate and regular rhythm.     Heart sounds: Normal heart sounds.  Pulmonary:     Effort: Pulmonary effort is normal.     Breath sounds: Normal breath sounds.  Skin:    General: Skin is warm and dry.  Neurological:     Mental Status: She is alert and oriented to person, place, and time.  Psychiatric:        Behavior: Behavior normal.     BP 130/72   Pulse 72   Ht 4\' 10"  (1.473 m)   Wt 116 lb (52.6 kg)   SpO2 98%   BMI 24.24 kg/m  Wt Readings from Last 3 Encounters:  07/06/20 116 lb (52.6 kg)  01/06/20 109 lb (49.4 kg)  08/05/19 113 lb (51.3 kg)     Health Maintenance Due  Topic Date Due  . COVID-19 Vaccine (3 - Booster for Moderna series) 02/28/2020    There are no preventive care reminders to display for this patient.  Lab Results  Component Value Date   TSH 6.07 (H) 04/15/2020   Lab Results  Component Value Date   WBC 6.8 09/02/2012   HGB 12.7 09/02/2012   HCT 38.3 09/02/2012   MCV 89.1 09/02/2012   PLT 354 09/02/2012   Lab Results  Component Value Date   NA 138 02/23/2020   K 5.3 02/23/2020   CO2 21 02/23/2020   GLUCOSE 88 02/23/2020   BUN 31 (H) 02/23/2020   CREATININE 1.21 (H) 02/23/2020   BILITOT 0.4 02/23/2020   ALKPHOS 60 07/24/2016   AST 24 02/23/2020   ALT 19 02/23/2020   PROT 7.1 02/23/2020   ALBUMIN 4.4 07/24/2016   CALCIUM 9.7 02/23/2020   Lab Results  Component Value Date   CHOL 274 (H) 02/23/2020   Lab Results  Component Value Date   HDL 56 02/23/2020   Lab Results  Component Value Date   LDLCALC 187 (H) 02/23/2020   Lab Results  Component Value Date   TRIG 160 (H) 02/23/2020   Lab Results  Component Value Date   CHOLHDL 4.9 02/23/2020   No results found for: HGBA1C    Assessment & Plan:   Problem List Items Addressed This Visit      Cardiovascular and Mediastinum   ESSENTIAL HYPERTENSION, BENIGN -  Primary    Well controlled. Continue current regimen. Follow up in  6 mo       Relevant Orders   BASIC METABOLIC PANEL WITH GFR     Endocrine  Hypothyroidism    Ring 88 mcg well.  Due to recheck TSH.      Relevant Orders   TSH     Genitourinary   CKD (chronic kidney disease) stage 3, GFR 30-59 ml/min (HCC)   Relevant Orders   BASIC METABOLIC PANEL WITH GFR      Rx for Tdap given.    Meds ordered this encounter  Medications  . AMBULATORY NON FORMULARY MEDICATION    Sig: Medication Name: Tdap IM x 1    Dispense:  1 Units    Refill:  0    Follow-up: Return in about 6 months (around 01/05/2021) for Hypertension and kidney check up .    [name], MD

## 2020-07-06 NOTE — Assessment & Plan Note (Signed)
Well controlled. Continue current regimen. Follow up in  6 mo  

## 2020-07-14 ENCOUNTER — Other Ambulatory Visit: Payer: Self-pay | Admitting: Family Medicine

## 2020-07-14 DIAGNOSIS — E039 Hypothyroidism, unspecified: Secondary | ICD-10-CM

## 2020-08-19 ENCOUNTER — Other Ambulatory Visit: Payer: Self-pay | Admitting: Family Medicine

## 2020-08-30 DIAGNOSIS — M47816 Spondylosis without myelopathy or radiculopathy, lumbar region: Secondary | ICD-10-CM | POA: Diagnosis not present

## 2020-08-30 DIAGNOSIS — M4726 Other spondylosis with radiculopathy, lumbar region: Secondary | ICD-10-CM | POA: Diagnosis not present

## 2020-08-30 DIAGNOSIS — M4316 Spondylolisthesis, lumbar region: Secondary | ICD-10-CM | POA: Diagnosis not present

## 2020-08-30 DIAGNOSIS — M5416 Radiculopathy, lumbar region: Secondary | ICD-10-CM | POA: Diagnosis not present

## 2020-08-30 DIAGNOSIS — Z981 Arthrodesis status: Secondary | ICD-10-CM | POA: Diagnosis not present

## 2020-08-30 DIAGNOSIS — M5136 Other intervertebral disc degeneration, lumbar region: Secondary | ICD-10-CM | POA: Diagnosis not present

## 2020-09-22 ENCOUNTER — Other Ambulatory Visit: Payer: Self-pay | Admitting: Family Medicine

## 2020-10-04 ENCOUNTER — Other Ambulatory Visit: Payer: Self-pay | Admitting: Family Medicine

## 2020-10-04 DIAGNOSIS — E039 Hypothyroidism, unspecified: Secondary | ICD-10-CM

## 2020-12-02 DIAGNOSIS — M4316 Spondylolisthesis, lumbar region: Secondary | ICD-10-CM | POA: Diagnosis not present

## 2020-12-02 DIAGNOSIS — M47816 Spondylosis without myelopathy or radiculopathy, lumbar region: Secondary | ICD-10-CM | POA: Diagnosis not present

## 2020-12-02 DIAGNOSIS — M5136 Other intervertebral disc degeneration, lumbar region: Secondary | ICD-10-CM | POA: Diagnosis not present

## 2020-12-02 DIAGNOSIS — M5416 Radiculopathy, lumbar region: Secondary | ICD-10-CM | POA: Diagnosis not present

## 2020-12-02 DIAGNOSIS — Z981 Arthrodesis status: Secondary | ICD-10-CM | POA: Diagnosis not present

## 2020-12-24 DIAGNOSIS — E039 Hypothyroidism, unspecified: Secondary | ICD-10-CM | POA: Diagnosis not present

## 2020-12-24 DIAGNOSIS — Z008 Encounter for other general examination: Secondary | ICD-10-CM | POA: Diagnosis not present

## 2020-12-24 DIAGNOSIS — M1990 Unspecified osteoarthritis, unspecified site: Secondary | ICD-10-CM | POA: Diagnosis not present

## 2020-12-24 DIAGNOSIS — G629 Polyneuropathy, unspecified: Secondary | ICD-10-CM | POA: Diagnosis not present

## 2020-12-24 DIAGNOSIS — I10 Essential (primary) hypertension: Secondary | ICD-10-CM | POA: Diagnosis not present

## 2020-12-24 DIAGNOSIS — I739 Peripheral vascular disease, unspecified: Secondary | ICD-10-CM | POA: Diagnosis not present

## 2020-12-24 DIAGNOSIS — M5430 Sciatica, unspecified side: Secondary | ICD-10-CM | POA: Diagnosis not present

## 2020-12-24 DIAGNOSIS — G8929 Other chronic pain: Secondary | ICD-10-CM | POA: Diagnosis not present

## 2020-12-24 DIAGNOSIS — Z87891 Personal history of nicotine dependence: Secondary | ICD-10-CM | POA: Diagnosis not present

## 2020-12-24 DIAGNOSIS — K219 Gastro-esophageal reflux disease without esophagitis: Secondary | ICD-10-CM | POA: Diagnosis not present

## 2020-12-24 DIAGNOSIS — Z8249 Family history of ischemic heart disease and other diseases of the circulatory system: Secondary | ICD-10-CM | POA: Diagnosis not present

## 2021-01-05 ENCOUNTER — Ambulatory Visit (INDEPENDENT_AMBULATORY_CARE_PROVIDER_SITE_OTHER): Payer: Medicare HMO | Admitting: Family Medicine

## 2021-01-05 ENCOUNTER — Encounter: Payer: Self-pay | Admitting: Family Medicine

## 2021-01-05 VITALS — BP 129/58 | HR 65 | Ht <= 58 in | Wt 114.0 lb

## 2021-01-05 DIAGNOSIS — R413 Other amnesia: Secondary | ICD-10-CM | POA: Diagnosis not present

## 2021-01-05 DIAGNOSIS — M65331 Trigger finger, right middle finger: Secondary | ICD-10-CM | POA: Diagnosis not present

## 2021-01-05 DIAGNOSIS — I10 Essential (primary) hypertension: Secondary | ICD-10-CM

## 2021-01-05 DIAGNOSIS — Z23 Encounter for immunization: Secondary | ICD-10-CM | POA: Diagnosis not present

## 2021-01-05 DIAGNOSIS — E039 Hypothyroidism, unspecified: Secondary | ICD-10-CM | POA: Diagnosis not present

## 2021-01-05 DIAGNOSIS — M7041 Prepatellar bursitis, right knee: Secondary | ICD-10-CM

## 2021-01-05 DIAGNOSIS — N1831 Chronic kidney disease, stage 3a: Secondary | ICD-10-CM

## 2021-01-05 MED ORDER — CLOTRIMAZOLE 10 MG MT TROC: 10.0000 mg | Freq: Every day | OROMUCOSAL | 2 refills | Status: DC

## 2021-01-05 MED ORDER — LEVOTHYROXINE SODIUM 88 MCG PO TABS: 88.0000 ug | ORAL_TABLET | Freq: Every day | ORAL | 2 refills | Status: DC

## 2021-01-05 NOTE — Assessment & Plan Note (Signed)
She feels like overall thyroid is well controlled.  Due to recheck TSH.  She has noticed a little bit more hair loss than usual on the top of her head.

## 2021-01-05 NOTE — Assessment & Plan Note (Addendum)
12/2020: MMSE score of 29/20.  Missed copying the overlapping pentagons.  Passing score based on high school education and age is 37.  We did discuss monitoring this over time we will do some additional labs today.  And consider brain MRI for further work-up if she feels like things are changing or progressing.

## 2021-01-05 NOTE — Assessment & Plan Note (Signed)
Well controlled. Continue current regimen. Follow up in  6 mo  

## 2021-01-05 NOTE — Progress Notes (Signed)
Established Patient Office Visit  Subjective:  Patient ID: Jessica Dawson, female    DOB: 07/29/1945  Age: 75 y.o. MRN: [id number]  CC:  Chief Complaint  Patient Dawson with   Hypothyroidism   Hypertension    HPI Jessica Dawson Dawson for   Hypertension- Pt denies chest pain, SOB, dizziness, or heart palpitations.  Taking meds as directed w/o problems.  Denies medication side effects.    She also feels like she has been battling trigger finger since mid summer in her right hand on her middle and fourth finger.  She says is getting to the point where it will lock at night and then when she wakes up to try to move the fingers a really painful.  She would like to have more definitive treatment.  She also has some concerns about her memory.  She is just noticed that she has been repeating herself a little bit more often her husband and daughter will make a comment that she is already told him something.  And even just when trying to remember something like what store she went to earlier that week she will have a hard time recalling the name of the store during a conversation.  She says her mom had dementia and she feels like her older sister has dementia even though she has not been formally diagnosed.  She says she just wants to be on top of it in case there is something that we can do to make it better.  She also noticed a swollen area on her right knee its been there for a little over a month.  She had been kneeling to pull some weeds and something bit her.  She was rubbing and scratching at the area after the bite and then noticed the swelling..  Other than that she does not member any specific trauma or injury.  And its not painful she does have a little pain behind the knee but that was there before the swelling was noted.  Past Medical History:  Diagnosis Date   GERD (gastroesophageal reflux disease)    Hiatal hernia    Hyperlipidemia    Hypertension    Hypothyroid    SVT  (supraventricular tachycardia) (HCC)     Past Surgical History:  Procedure Laterality Date   ABLATION     CATARACT EXTRACTION     lapinectomy  March 6th, 2020    Family History  Problem Relation Age of Onset   Heart disease Father        MI at age 37   Hyperlipidemia Sister    Hypertension Sister     Social History   Socioeconomic History   Marital status: Married    Spouse name: Fayrene Fearing   Number of children: 3   Years of education: 10   Highest education level: 10th grade  Occupational History   Occupation: Dispensing optician    Comment: retired  Tobacco Use   Smoking status: Former   Smokeless tobacco: Never  Building services engineer Use: Never used  Substance and Sexual Activity   Alcohol use: No   Drug use: No   Sexual activity: Not Currently  Other Topics Concern   Not on file  Social History Narrative   Recently had back surgery.   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   Cuts grass and cleans home   Social Determinants of Health   Financial Resource Strain: Not on file  Food Insecurity: Not on file  Transportation  Needs: Not on file  Physical Activity: Not on file  Stress: Not on file  Social Connections: Not on file  Intimate Partner Violence: Not on file    Outpatient Medications Prior to Visit  Medication Sig Dispense Refill   telmisartan (MICARDIS) 80 MG tablet TAKE 1 TABLET BY MOUTH ONCE DAILY . APPOINTMENT REQUIRED FOR FUTURE REFILLS 90 tablet 1   gabapentin (NEURONTIN) 300 MG capsule Take 1 capsule by mouth at bedtime 90 capsule 0   omeprazole (PRILOSEC) 40 MG capsule Take 1 capsule by mouth once daily 90 capsule 0   AMBULATORY NON FORMULARY MEDICATION Medication Name: Tdap IM x 1 1 Units 0   EUTHYROX 88 MCG tablet TAKE 1 TABLET BY MOUTH ONCE DAILY BEFORE BREAKFAST 30 tablet 2   No facility-administered medications prior to visit.    Allergies  Allergen Reactions   Crestor [Rosuvastatin] Other (See Comments)    Joint aches    Lipitor [Atorvastatin]  Other (See Comments)    Muscle cramps    Livalo [Pitavastatin] Other (See Comments)    constipation   Pravastatin Sodium Other (See Comments)    REACTION: Myalgias    ROS Review of Systems    Objective:    Physical Exam Constitutional:      Appearance: Normal appearance. She is well-developed.  HENT:     Head: Normocephalic and atraumatic.  Cardiovascular:     Rate and Rhythm: Normal rate and regular rhythm.     Heart sounds: Normal heart sounds.  Pulmonary:     Effort: Pulmonary effort is normal.     Breath sounds: Normal breath sounds.  Skin:    General: Skin is warm and dry.  Neurological:     Mental Status: She is alert and oriented to person, place, and time.  Psychiatric:        Behavior: Behavior normal.    BP (!) 129/58   Pulse 65   Ht 4\' 10"  (1.473 m)   Wt 114 lb (51.7 kg)   SpO2 100%   BMI 23.83 kg/m  Wt Readings from Last 3 Encounters:  01/05/21 114 lb (51.7 kg)  07/06/20 116 lb (52.6 kg)  01/06/20 109 lb (49.4 kg)     There are no preventive care reminders to display for this patient.   There are no preventive care reminders to display for this patient.  Lab Results  Component Value Date   TSH 0.40 07/06/2020   Lab Results  Component Value Date   WBC 6.8 09/02/2012   HGB 12.7 09/02/2012   HCT 38.3 09/02/2012   MCV 89.1 09/02/2012   PLT 354 09/02/2012   Lab Results  Component Value Date   NA 141 07/06/2020   K 4.5 07/06/2020   CO2 27 07/06/2020   GLUCOSE 92 07/06/2020   BUN 25 07/06/2020   CREATININE 1.19 (H) 07/06/2020   BILITOT 0.4 02/23/2020   ALKPHOS 60 07/24/2016   AST 24 02/23/2020   ALT 19 02/23/2020   PROT 7.1 02/23/2020   ALBUMIN 4.4 07/24/2016   CALCIUM 9.8 07/06/2020   Lab Results  Component Value Date   CHOL 274 (H) 02/23/2020   Lab Results  Component Value Date   HDL 56 02/23/2020   Lab Results  Component Value Date   LDLCALC 187 (H) 02/23/2020   Lab Results  Component Value Date   TRIG 160 (H)  02/23/2020   Lab Results  Component Value Date   CHOLHDL 4.9 02/23/2020   No results found for: HGBA1C  Assessment & Plan:   Problem List Items Addressed This Visit       Cardiovascular and Mediastinum   ESSENTIAL HYPERTENSION, BENIGN - Primary    Well controlled. Continue current regimen. Follow up in  6 mo       Relevant Orders   Lipid Panel w/reflex Direct LDL   COMPLETE METABOLIC PANEL WITH GFR   CBC   TSH   RPR   B12   Vitamin B1     Endocrine   Hypothyroidism    She feels like overall thyroid is well controlled.  Due to recheck TSH.  She has noticed a little bit more hair loss than usual on the top of her head.      Relevant Medications   levothyroxine (EUTHYROX) 88 MCG tablet   Other Relevant Orders   Lipid Panel w/reflex Direct LDL   COMPLETE METABOLIC PANEL WITH GFR   CBC   TSH   RPR   B12   Vitamin B1     Genitourinary   CKD (chronic kidney disease) stage 3, GFR 30-59 ml/min (HCC)    Continuing to follow renal function every 6 months.  No recent changes.      Relevant Orders   Lipid Panel w/reflex Direct LDL   COMPLETE METABOLIC PANEL WITH GFR   CBC   TSH   RPR   B12   Vitamin B1     Other   Memory change    12/2020: MMSE score of 29/20.  Missed copying the overlapping pentagons.  Passing score based on high school education and age is 58.  We did discuss monitoring this over time we will do some additional labs today.  And consider brain MRI for further work-up if she feels like things are changing or progressing.      Relevant Orders   Lipid Panel w/reflex Direct LDL   COMPLETE METABOLIC PANEL WITH GFR   CBC   TSH   RPR   B12   Vitamin B1   Other Visit Diagnoses     Trigger middle finger of right hand       Need for immunization against influenza       Relevant Orders   Flu Vaccine QUAD High Dose(Fluad) (Completed)   Prepatellar bursitis of right knee          She did have her tetanus done at [facility] in May of this  year.  Prepatellar bursitis, right  - she does have fullness of the prepatellar bursa on the right knee.  Nontender.  No erythema.  No joint line pain.  Discussed that this is benign.  Could consider compression over the area though it is a little difficult to wrap because it is the knee.  It should gradually improve on its own as long as she is not putting pressure on the area  Meds ordered this encounter  Medications   clotrimazole (MYCELEX) 10 MG troche    Sig: Take 1 tablet (10 mg total) by mouth 5 (five) times daily.    Dispense:  140 tablet    Refill:  2   levothyroxine (EUTHYROX) 88 MCG tablet    Sig: Take 1 tablet (88 mcg total) by mouth daily before breakfast.    Dispense:  30 tablet    Refill:  2     Follow-up: Return in about 1 month (around 02/05/2021) for memory test .    [name], MD

## 2021-01-05 NOTE — Assessment & Plan Note (Signed)
Continuing to follow renal function every 6 months.  No recent changes.

## 2021-01-07 NOTE — Progress Notes (Signed)
Call pt: Her cholesterol is still really really high. Would she be willing to try oe of the new cholesterol medications that is not a statin like Praluent?  Her metabolic panel and blood count are OK. Thyroid, and B12 look good. Negative RPR.  We check that for her memory and that is normal.   B1 still pending.

## 2021-01-12 LAB — COMPLETE METABOLIC PANEL WITH GFR
AG Ratio: 1.7 (calc) (ref 1.0–2.5)
ALT: 13 U/L (ref 6–29)
AST: 18 U/L (ref 10–35)
Albumin: 4.6 g/dL (ref 3.6–5.1)
Alkaline phosphatase (APISO): 90 U/L (ref 37–153)
BUN: 25 mg/dL (ref 7–25)
CO2: 21 mmol/L (ref 20–32)
Calcium: 9.6 mg/dL (ref 8.6–10.4)
Chloride: 104 mmol/L (ref 98–110)
Creat: 0.98 mg/dL (ref 0.60–1.00)
Globulin: 2.7 g/dL (calc) (ref 1.9–3.7)
Glucose, Bld: 83 mg/dL (ref 65–99)
Potassium: 4.8 mmol/L (ref 3.5–5.3)
Sodium: 140 mmol/L (ref 135–146)
Total Bilirubin: 0.5 mg/dL (ref 0.2–1.2)
Total Protein: 7.3 g/dL (ref 6.1–8.1)
eGFR: 59 mL/min/{1.73_m2} — ABNORMAL LOW (ref >=60)

## 2021-01-12 LAB — CBC
HCT: 34.9 % — ABNORMAL LOW (ref 35.0–45.0)
Hemoglobin: 11.7 g/dL (ref 11.7–15.5)
MCH: 30.5 pg (ref 27.0–33.0)
MCHC: 33.5 g/dL (ref 32.0–36.0)
MCV: 90.9 fL (ref 80.0–100.0)
MPV: 10 fL (ref 7.5–12.5)
Platelets: 290 10*3/uL (ref 140–400)
RBC: 3.84 10*6/uL (ref 3.80–5.10)
RDW: 13.7 % (ref 11.0–15.0)
WBC: 6.4 10*3/uL (ref 3.8–10.8)

## 2021-01-12 LAB — LIPID PANEL W/REFLEX DIRECT LDL
Cholesterol: 274 mg/dL — ABNORMAL HIGH (ref <200)
HDL: 52 mg/dL (ref >=50)
LDL Cholesterol (Calc): 188 mg/dL (calc) — ABNORMAL HIGH
Non-HDL Cholesterol (Calc): 222 mg/dL (calc) — ABNORMAL HIGH (ref <130)
Total CHOL/HDL Ratio: 5.3 (calc) — ABNORMAL HIGH (ref <5.0)
Triglycerides: 170 mg/dL — ABNORMAL HIGH (ref <150)

## 2021-01-12 LAB — TSH: TSH: 0.55 mIU/L (ref 0.40–4.50)

## 2021-01-12 LAB — RPR: RPR Ser Ql: NONREACTIVE

## 2021-01-12 LAB — VITAMIN B12: Vitamin B-12: 322 pg/mL (ref 200–1100)

## 2021-01-12 LAB — VITAMIN B1: Vitamin B1 (Thiamine): 11 nmol/L (ref 8–30)

## 2021-01-12 NOTE — Progress Notes (Signed)
Vitamin B-1 is normal.

## 2021-01-13 ENCOUNTER — Telehealth: Payer: Self-pay | Admitting: Family Medicine

## 2021-01-13 DIAGNOSIS — E7800 Pure hypercholesterolemia, unspecified: Secondary | ICD-10-CM

## 2021-01-13 DIAGNOSIS — R6889 Other general symptoms and signs: Secondary | ICD-10-CM

## 2021-01-13 NOTE — Telephone Encounter (Signed)
Call pt: Based on results on home test she needs formal ABIs of her leg to test for peripheral vascular disease. See if she is OK with [place] ordering this. Would have to be schedule at [facility] in [place].

## 2021-01-13 NOTE — Telephone Encounter (Signed)
LVM for pt to call to discuss having ABIs done.  Tiajuana Amass, CMA

## 2021-01-14 MED ORDER — PRALUENT 150 MG/ML ~~LOC~~ SOAJ: 75.0000 mg | SUBCUTANEOUS | 3 refills | Status: DC

## 2021-01-14 NOTE — Telephone Encounter (Signed)
Meds ordered this encounter  Medications   Alirocumab (PRALUENT) 150 MG/ML SOAJ    Sig: Inject 75 mg into the skin every 14 (fourteen) days.    Dispense:  6 mL    Refill:  3   No orders of the defined types were placed in this encounter.

## 2021-01-14 NOTE — Telephone Encounter (Signed)
Pt called back she would like to move fwd w/ABI's.   I also asked her about taking the Praluent she would like to go ahead and start this. She stated that when she called the insurance company she was told that her doctor would need to approve this.   I told her that should this need a PA we can process this.

## 2021-01-18 DIAGNOSIS — H02403 Unspecified ptosis of bilateral eyelids: Secondary | ICD-10-CM | POA: Diagnosis not present

## 2021-01-18 DIAGNOSIS — H43813 Vitreous degeneration, bilateral: Secondary | ICD-10-CM | POA: Diagnosis not present

## 2021-01-18 DIAGNOSIS — Z961 Presence of intraocular lens: Secondary | ICD-10-CM | POA: Diagnosis not present

## 2021-01-18 DIAGNOSIS — H527 Unspecified disorder of refraction: Secondary | ICD-10-CM | POA: Diagnosis not present

## 2021-01-18 DIAGNOSIS — H40033 Anatomical narrow angle, bilateral: Secondary | ICD-10-CM | POA: Diagnosis not present

## 2021-01-18 DIAGNOSIS — H53001 Unspecified amblyopia, right eye: Secondary | ICD-10-CM | POA: Diagnosis not present

## 2021-01-18 DIAGNOSIS — H47323 Drusen of optic disc, bilateral: Secondary | ICD-10-CM | POA: Diagnosis not present

## 2021-01-26 ENCOUNTER — Other Ambulatory Visit: Payer: Self-pay | Admitting: Family Medicine

## 2021-01-31 ENCOUNTER — Telehealth: Payer: Self-pay

## 2021-01-31 NOTE — Telephone Encounter (Signed)
Medication: Alirocumab (PRALUENT) 150 MG/ML SOAJ Prior authorization submitted via CoverMyMeds on 01/31/2021 PA submission pending

## 2021-02-07 ENCOUNTER — Ambulatory Visit: Payer: Medicare HMO | Admitting: Family Medicine

## 2021-02-09 ENCOUNTER — Ambulatory Visit (INDEPENDENT_AMBULATORY_CARE_PROVIDER_SITE_OTHER): Payer: Medicare HMO | Admitting: Family Medicine

## 2021-02-09 ENCOUNTER — Encounter: Payer: Self-pay | Admitting: Family Medicine

## 2021-02-09 ENCOUNTER — Other Ambulatory Visit: Payer: Self-pay

## 2021-02-09 VITALS — BP 103/63 | HR 66 | Ht <= 58 in | Wt 120.0 lb

## 2021-02-09 DIAGNOSIS — E039 Hypothyroidism, unspecified: Secondary | ICD-10-CM | POA: Diagnosis not present

## 2021-02-09 DIAGNOSIS — N1831 Chronic kidney disease, stage 3a: Secondary | ICD-10-CM

## 2021-02-09 DIAGNOSIS — I10 Essential (primary) hypertension: Secondary | ICD-10-CM | POA: Diagnosis not present

## 2021-02-09 DIAGNOSIS — R413 Other amnesia: Secondary | ICD-10-CM | POA: Diagnosis not present

## 2021-02-09 NOTE — Assessment & Plan Note (Signed)
Home blood pressures have been well controlled she has been keeping an eye on it advised him to go up a little bit when she is here.

## 2021-02-09 NOTE — Assessment & Plan Note (Signed)
Plan to recheck MMSE at her follow-up in 6 months.  She has not really noticed any changes in the last month.  We did go over the lab work together and everything honestly looked pretty good.  She will keep an eye on things and let me know if anything is changing progressing or getting worse.  Discussed that we will track of her time and consider MRI in the future if needed.

## 2021-02-09 NOTE — Progress Notes (Signed)
Established Patient Office Visit  Subjective:  Patient ID: Jessica Dawson, female    DOB: 10-19-1945  Age: 75 y.o. MRN: [id number]  CC:  Chief Complaint  Patient presents with   Follow-up         HPI Jessica Dawson presents for low up of memory.  When I last saw her about 4 weeks ago we discussed some concerns that she had in regards to her memory like repeating herself.  We did do some labs just to rule out abnormalities or deficiencies and everything looked normal.  Rate was well controlled.   Past Medical History:  Diagnosis Date   GERD (gastroesophageal reflux disease)    Hiatal hernia    Hyperlipidemia    Hypertension    Hypothyroid    SVT (supraventricular tachycardia) (HCC)     Past Surgical History:  Procedure Laterality Date   ABLATION     CATARACT EXTRACTION     lapinectomy  March 6th, 2020    Family History  Problem Relation Age of Onset   Heart disease Father        MI at age 42   Hyperlipidemia Sister    Hypertension Sister     Social History   Socioeconomic History   Marital status: Married    Spouse name: Jeneen Rinks   Number of children: 3   Years of education: 10   Highest education level: 10th grade  Occupational History   Occupation: International aid/development worker    Comment: retired  Tobacco Use   Smoking status: Former   Smokeless tobacco: Never  Scientific laboratory technician Use: Never used  Substance and Sexual Activity   Alcohol use: No   Drug use: No   Sexual activity: Not Currently  Other Topics Concern   Not on file  Social History Narrative   Recently had back surgery.   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 140 tablet 2   gabapentin (NEURONTIN) 300 MG capsule Take 1 capsule by mouth at bedtime 90 capsule 0   levothyroxine (EUTHYROX) 88 MCG tablet Take 1 tablet (88 mcg total) by mouth daily before breakfast. 30 tablet 2   omeprazole (PRILOSEC) 40 MG capsule Take 1 capsule by mouth once daily 90 capsule 1   telmisartan (MICARDIS) 80 MG tablet TAKE 1 TABLET BY MOUTH ONCE DAILY . APPOINTMENT REQUIRED FOR FUTURE REFILLS 90 tablet 1   Alirocumab (PRALUENT) 150 MG/ML SOAJ Inject 75 mg into the skin every 14 (fourteen) days. (Patient not taking: Reported on 02/09/2021) 6 mL 3   No facility-administered medications prior to visit.    Allergies  Allergen Reactions   Crestor [Rosuvastatin] Other (See Comments)    Joint aches    Lipitor [Atorvastatin] Other (See Comments)    Muscle cramps    Livalo [Pitavastatin] Other (See Comments)    constipation   Pravastatin Sodium Other (See Comments)    REACTION: Myalgias    ROS Review of Systems    Objective:  Physical Exam  BP 103/63 Comment: home reading  Pulse 66   Ht 4' 10" (1.473 m)   Wt 120 lb (54.4 kg)   SpO2 100%   BMI 25.08 kg/m  Wt Readings from Last 3 Encounters:  02/09/21 120 lb (54.4 kg)  01/05/21 114 lb (51.7 kg)  07/06/20 116 lb (52.6 kg)     There are no preventive care reminders to display for this patient.  There are no preventive care reminders to display for this patient.  Lab Results  Component Value Date   TSH 0.55 01/05/2021   Lab Results  Component Value Date   WBC 6.4 01/05/2021   HGB 11.7 01/05/2021   HCT 34.9 (L) 01/05/2021   MCV 90.9 01/05/2021   PLT 290 01/05/2021   Lab Results  Component Value Date   NA 140 01/05/2021   K 4.8 01/05/2021   CO2 21 01/05/2021   GLUCOSE 83 01/05/2021   BUN 25 01/05/2021   CREATININE 0.98 01/05/2021   BILITOT 0.5 01/05/2021   ALKPHOS 60  07/24/2016   AST 18 01/05/2021   ALT 13 01/05/2021   PROT 7.3 01/05/2021   ALBUMIN 4.4 07/24/2016   CALCIUM 9.6 01/05/2021   EGFR 59 (L) 01/05/2021   Lab Results  Component Value Date   CHOL 274 (H) 01/05/2021   Lab Results  Component Value Date   HDL 52 01/05/2021   Lab Results  Component Value Date   LDLCALC 188 (H) 01/05/2021   Lab Results  Component Value Date   TRIG 170 (H) 01/05/2021   Lab Results  Component Value Date   CHOLHDL 5.3 (H) 01/05/2021   No results found for: HGBA1C    Assessment & Plan:   Problem List Items Addressed This Visit       Cardiovascular and Mediastinum   ESSENTIAL HYPERTENSION, BENIGN    Home blood pressures have been well controlled she has been keeping an eye on it advised him to go up a little bit when she is here.        Endocrine   Hypothyroidism    Check thyroid levels after last visit and everything looked good TSH was 0.5 a little bit on the lower end so we will keep an eye on this but for now we will continue current regimen.        Genitourinary   RESOLVED: CKD (chronic kidney disease) stage 3, GFR 30-59 ml/min (HCC) - Primary     Other   Memory change    Plan to recheck MMSE at her follow-up in 6 months.  She has not really noticed any changes in the last month.  We did go over the lab work together and everything honestly looked pretty good.  She will keep an eye on things and let me know if anything is changing progressing or getting worse.  Discussed that we will track of her time and consider MRI in the future if needed.       No orders of the defined types were placed in this encounter.   Follow-up: No follow-ups on file.    Catherine Metheney, MD 

## 2021-02-09 NOTE — Assessment & Plan Note (Signed)
Check thyroid levels after last visit and everything looked good TSH was 0.5 a little bit on the lower end so we will keep an eye on this but for now we will continue current regimen.

## 2021-02-10 NOTE — Telephone Encounter (Signed)
Medication: Alirocumab (PRALUENT) 150 MG/ML SOAJ Prior authorization determination received Medication has been approved Approval dates: 03/13/2020-03/12/2021  Patient aware via: phone Pharmacy aware: Yes Provider aware via this encounter

## 2021-03-07 ENCOUNTER — Other Ambulatory Visit: Payer: Self-pay | Admitting: Family Medicine

## 2021-03-21 ENCOUNTER — Ambulatory Visit (INDEPENDENT_AMBULATORY_CARE_PROVIDER_SITE_OTHER): Payer: Medicare HMO | Admitting: Family Medicine

## 2021-03-21 DIAGNOSIS — Z0000 Encounter for general adult medical examination without abnormal findings: Secondary | ICD-10-CM

## 2021-03-21 DIAGNOSIS — Z780 Asymptomatic menopausal state: Secondary | ICD-10-CM

## 2021-03-21 NOTE — Patient Instructions (Addendum)
MEDICARE ANNUAL WELLNESS VISIT Health Maintenance Summary and Written Plan of Care  Ms. Jessica Dawson ,  Thank you for allowing me to perform your Medicare Annual Wellness Visit and for your ongoing commitment to your health.   Health Maintenance & Immunization History Health Maintenance  Topic Date Due   COVID-19 Vaccine (4 - Booster for Moderna series) 01/22/2023 (Originally 12/15/2020)   Zoster Vaccines- Shingrix (1 of 2) 04/08/2023 (Originally 05/25/1964)   TETANUS/TDAP  09/28/2030   Pneumonia Vaccine 63+ Years old  Completed   INFLUENZA VACCINE  Completed   DEXA SCAN  Completed   Hepatitis C Screening  Completed   HPV VACCINES  Aged Out   COLONOSCOPY (Pts 45-42yrs Insurance coverage will need to be confirmed)  Discontinued   Immunization History  Administered Date(s) Administered   Fluad Quad(high Dose 65+) 11/26/2018, 01/06/2020, 01/05/2021   Influenza Split 11/27/2011   Influenza Whole 12/12/2007, 12/23/2008, 12/13/2009, 10/31/2010   Influenza, High Dose Seasonal PF 12/04/2016, 12/17/2017   Influenza,inj,Quad PF,6+ Mos 12/09/2012, 12/10/2013, 11/30/2014, 01/14/2015, 11/29/2015   Moderna Sars-Covid-2 Vaccination 07/31/2019, 08/29/2019, 10/20/2020   Pneumococcal Conjugate-13 08/24/2014   Pneumococcal Polysaccharide-23 10/30/2011   Tdap 10/31/2010, 09/27/2020   Zoster, Live 05/11/2012    These are the patient goals that we discussed:  Goals Addressed               This Visit's Progress     Patient Stated (pt-stated)        03/21/2021 AWV Goal: Improved Nutrition/Diet  Patient will verbalize understanding that diet plays an important role in overall health and that a poor diet is a risk factor for many chronic medical conditions.  Over the next year, patient will improve self management of their diet by incorporating more water in her diet. Patient will utilize available community resources to help with food acquisition if needed (ex: food pantries, [address], etc) Patient  will work with nutrition specialist if a referral was made          This is a list of Health Maintenance Items that are overdue or due now: Bone densitometry screening Shingles vaccine  Orders/Referrals Placed Today: Orders Placed This Encounter  Procedures   DEXAScan    Standing Status:   Future    Standing Expiration Date:   03/21/2022    Scheduling Instructions:     Please call patient to schedule. She wants to have one in February.    Order Specific Question:   Reason for exam:    Answer:   Post menopausal    Order Specific Question:   Preferred imaging location?    Answer:   [name]   (Contact our referral department at [phone] if you have not spoken with someone about your referral appointment within the next 5 days)    Follow-up Plan Follow-up with Agapito Games, MD as planned Bone density referral has been sent and they will call you to make that appointment.  Shingles vaccine can be done at any pharmacy. Patient did not want to schedule medicare wellness visit for next year at this time. Medicare wellness visit in one year. AVS printed and mailed to the patient.      Health Maintenance, Female Adopting a healthy lifestyle and getting preventive care are important in promoting health and wellness. Ask your health care provider about: The right schedule for you to have regular tests and exams. Things you can do on your own to prevent diseases and keep yourself healthy. What should I know about diet,  weight, and exercise? Eat a healthy diet  Eat a diet that includes plenty of vegetables, fruits, low-fat dairy products, and lean protein. Do not eat a lot of foods that are high in solid fats, added sugars, or sodium. Maintain a healthy weight Body mass index (BMI) is used to identify weight problems. It estimates body fat based on height and weight. Your health care provider can help determine your BMI and help you achieve or maintain a  healthy weight. Get regular exercise Get regular exercise. This is one of the most important things you can do for your health. Most adults should: Exercise for at least 150 minutes each week. The exercise should increase your heart rate and make you sweat (moderate-intensity exercise). Do strengthening exercises at least twice a week. This is in addition to the moderate-intensity exercise. Spend less time sitting. Even light physical activity can be beneficial. Watch cholesterol and blood lipids Have your blood tested for lipids and cholesterol at 78 years of age, then have this test every 5 years. Have your cholesterol levels checked more often if: Your lipid or cholesterol levels are high. You are older than 78 years of age. You are at high risk for heart disease. What should I know about cancer screening? Depending on your health history and family history, you may need to have cancer screening at various ages. This may include screening for: Breast cancer. Cervical cancer. Colorectal cancer. Skin cancer. Lung cancer. What should I know about heart disease, diabetes, and high blood pressure? Blood pressure and heart disease High blood pressure causes heart disease and increases the risk of stroke. This is more likely to develop in people who have high blood pressure readings or are overweight. Have your blood pressure checked: Every 3-5 years if you are 43-79 years of age. Every year if you are 8 years old or older. Diabetes Have regular diabetes screenings. This checks your fasting blood sugar level. Have the screening done: Once every three years after age 47 if you are at a normal weight and have a low risk for diabetes. More often and at a younger age if you are overweight or have a high risk for diabetes. What should I know about preventing infection? Hepatitis B If you have a higher risk for hepatitis B, you should be screened for this virus. Talk with your health care  provider to find out if you are at risk for hepatitis B infection. Hepatitis C Testing is recommended for: Everyone born from [birth year] through 1967. Anyone with known risk factors for hepatitis C. Sexually transmitted infections (STIs) Get screened for STIs, including gonorrhea and chlamydia, if: You are sexually active and are younger than 78 years of age. You are older than 78 years of age and your health care provider tells you that you are at risk for this type of infection. Your sexual activity has changed since you were last screened, and you are at increased risk for chlamydia or gonorrhea. Ask your health care provider if you are at risk. Ask your health care provider about whether you are at high risk for HIV. Your health care provider may recommend a prescription medicine to help prevent HIV infection. If you choose to take medicine to prevent HIV, you should first get tested for HIV. You should then be tested every 3 months for as long as you are taking the medicine. Pregnancy If you are about to stop having your period (premenopausal) and you may become pregnant, seek counseling before you  get pregnant. Take 400 to 800 micrograms (mcg) of folic acid every day if you become pregnant. Ask for birth control (contraception) if you want to prevent pregnancy. Osteoporosis and menopause Osteoporosis is a disease in which the bones lose minerals and strength with aging. This can result in bone fractures. If you are 588 years old or older, or if you are at risk for osteoporosis and fractures, ask your health care provider if you should: Be screened for bone loss. Take a calcium or vitamin D supplement to lower your risk of fractures. Be given hormone replacement therapy (HRT) to treat symptoms of menopause. Follow these instructions at home: Alcohol use Do not drink alcohol if: Your health care provider tells you not to drink. You are pregnant, may be pregnant, or are planning to become  pregnant. If you drink alcohol: Limit how much you have to: 0-1 drink a day. Know how much alcohol is in your drink. In the [place] one drink equals one 12 oz bottle of beer (355 mL), one 5 oz glass of wine (148 mL), or one 1 oz glass of hard liquor (44 mL). Lifestyle Do not use any products that contain nicotine or tobacco. These products include cigarettes, chewing tobacco, and vaping devices, such as e-cigarettes. If you need help quitting, ask your health care provider. Do not use street drugs. Do not share needles. Ask your health care provider for help if you need support or information about quitting drugs. General instructions Schedule regular health, dental, and eye exams. Stay current with your vaccines. Tell your health care provider if: You often feel depressed. You have ever been abused or do not feel safe at home. Summary Adopting a healthy lifestyle and getting preventive care are important in promoting health and wellness. Follow your health care provider's instructions about healthy diet, exercising, and getting tested or screened for diseases. Follow your health care provider's instructions on monitoring your cholesterol and blood pressure. This information is not intended to replace advice given to you by your health care provider. Make sure you discuss any questions you have with your health care provider. Document Revised: 07/19/2020 Document Reviewed: 07/19/2020 Elsevier Patient Education  2022 [employer]

## 2021-03-21 NOTE — Progress Notes (Signed)
MEDICARE ANNUAL WELLNESS VISIT  03/21/2021  Telephone Visit Disclaimer This Medicare AWV was conducted by telephone due to national recommendations for restrictions regarding the COVID-19 Pandemic (e.g. social distancing).  I verified, using two identifiers, that I am speaking with Jessica Dawson or their authorized healthcare agent. I discussed the limitations, risks, security, and privacy concerns of performing an evaluation and management service by telephone and the potential availability of an in-person appointment in the future. The patient expressed understanding and agreed to proceed.  Location of Patient: Home Location of Provider (nurse):  In the office.  Subjective:    Jessica Dawson is a 76 y.o. female patient of Dawson, Jessica Kocher, MD who had a Medicare Annual Wellness Visit today via telephone. [name] is Retired and lives with their spouse. she had 3 children, but one has deceased. she reports that she is socially active and does interact with friends/family regularly. she is minimally physically active and enjoys hiking and gardening.  Patient Care Team: Jessica Marry, MD as PCP - General Jessica Dawson, Jessica Rankin., MD as Referring Physician (Cardiology) Jessica Menghini, MD as Referring Physician (Cardiology)  Advanced Directives 03/21/2021 07/06/2020 09/02/2018  Does Patient Have a Medical Advance Directive? No No No  Would patient like information on creating a medical advance directive? No - Patient declined Yes (MAU/Ambulatory/Procedural Areas - Information given) Yes (MAU/Ambulatory/Procedural Areas - Information given)    Hospital Utilization Over the Past 12 Months: # of hospitalizations or ER visits: 1 # of surgeries: 1  Review of Systems    Patient reports that her overall health is unchanged compared to last year.  History obtained from chart review and the patient  Patient Reported Readings (BP, Pulse, CBG, Weight, etc) none  Pain Assessment Pain :  0-10 Pain Score: 1  Pain Type: Chronic pain Pain Location: Back Pain Descriptors / Indicators: Aching Pain Onset: More than a month ago Pain Frequency: Intermittent Pain Relieving Factors: Gabapentin  Pain Relieving Factors: Gabapentin  Current Medications & Allergies (verified) Allergies as of 03/21/2021       Reactions   Crestor [rosuvastatin] Other (See Comments)   Joint aches   Lipitor [atorvastatin] Other (See Comments)   Muscle cramps   Livalo [pitavastatin] Other (See Comments)   constipation   Pravastatin Sodium Other (See Comments)   REACTION: Myalgias        Medication List        Accurate as of March 21, 2021 10:37 AM. If you have any questions, ask your nurse or doctor.          clotrimazole 10 MG troche Commonly known as: MYCELEX Take 1 tablet (10 mg total) by mouth 5 (five) times daily.   gabapentin 300 MG capsule Commonly known as: NEURONTIN Take 1 capsule by mouth at bedtime What changed:  how much to take how to take this when to take this additional instructions   levothyroxine 88 MCG tablet Commonly known as: Euthyrox Take 1 tablet (88 mcg total) by mouth daily before breakfast.   omeprazole 40 MG capsule Commonly known as: PRILOSEC Take 1 capsule by mouth once daily   Praluent 150 MG/ML Soaj Generic drug: Alirocumab Inject 75 mg into the skin every 14 (fourteen) days.   telmisartan 80 MG tablet Commonly known as: MICARDIS TAKE 1 TABLET BY MOUTH ONCE DAILY . APPOINTMENT REQUIRED FOR FUTURE REFILLS        History (reviewed): Past Medical History:  Diagnosis Date   GERD (gastroesophageal reflux disease)  Hiatal hernia    Hyperlipidemia    Hypertension    Hypothyroid    SVT (supraventricular tachycardia) [facility])    Past Surgical History:  Procedure Laterality Date   ABLATION     CATARACT EXTRACTION     lapinectomy  March 6th, 2020   Family History  Problem Relation Age of Onset   Heart disease Father        MI at  age 15   Hyperlipidemia Sister    Hypertension Sister    Social History   Socioeconomic History   Marital status: Married    Spouse name: Jessica Dawson   Number of children: 3   Years of education: 10   Highest education level: 10th grade  Occupational History   Occupation: International aid/development worker    Comment: retired  Tobacco Use   Smoking status: Former   Smokeless tobacco: Never  Scientific laboratory technician Use: Never used  Substance and Sexual Activity   Alcohol use: No   Drug use: No   Sexual activity: Not Currently  Other Topics Concern   Not on file  Social History Narrative   Recently had back surgery.   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   Cuts grass and cleans home   Social Determinants of Health   Financial Resource Strain: Low Risk    Difficulty of Paying Living Expenses: Not hard at all  Food Insecurity: No Food Insecurity   Worried About Charity fundraiser in the Last Year: Never true   Arboriculturist in the Last Year: Never true  Transportation Needs: No Transportation Needs   Lack of Transportation (Medical): No   Lack of Transportation (Non-Medical): No  Physical Activity: Inactive   Days of Exercise per Week: 0 days   Minutes of Exercise per Session: 0 min  Stress: No Stress Concern Present   Feeling of Stress : Not at all  Social Connections: Socially Integrated   Frequency of Communication with Friends and Family: More than three times a week   Frequency of Social Gatherings with Friends and Family: Once a week   Attends Religious Services: More than 4 times per year   Active Member of [employer] or Organizations: Yes   Attends Archivist Meetings: More than 4 times per year   Marital Status: Married    Activities of Daily Living In your present state of health, do you have any difficulty performing the following activities: 03/21/2021  Hearing? N  Vision? N  Difficulty concentrating or making decisions? Y  Comment some memory loss.  Walking or climbing stairs?  Y  Comment doesn't climb stairs.  Dressing or bathing? N  Doing errands, shopping? Y  Comment patient doesn't drive.  Preparing Food and eating ? N  Using the Toilet? N  In the past six months, have you accidently leaked urine? N  Do you have problems with loss of bowel control? N  Managing your Medications? N  Managing your Finances? N  Housekeeping or managing your Housekeeping? N  Some recent data might be hidden    Patient Education/ Literacy How often do you need to have someone help you when you read instructions, pamphlets, or other written materials from your doctor or pharmacy?: 2 - Rarely What is the last grade level you completed in school?: 10th grade.  Exercise Current Exercise Habits: Home exercise routine, Type of exercise: stretching;walking, Time (Minutes): 30, Frequency (Times/Week): 3, Weekly Exercise (Minutes/Week): 90, Intensity: Mild, Exercise limited by: orthopedic condition(s)  Diet Patient reports consuming 3 meals a day and 1 snack(s) a day Patient reports that her primary diet is: Regular Patient reports that she does have regular access to food.   Depression Screen PHQ 2/9 Scores 03/21/2021 07/06/2020 01/06/2020 09/02/2018 12/17/2017 01/29/2017 07/24/2016  PHQ - 2 Score 0 0 1 0 0 0 0  PHQ- 9 Score - - - - - - 2     Fall Risk Fall Risk  03/21/2021 07/06/2020 01/06/2020 01/06/2019 09/02/2018  Falls in the past year? 1 1 1 1 1   Number falls in past yr: 1 1 0 0 -  Injury with Fall? 1 0 0 1 -  Risk for fall due to : History of fall(s);Orthopedic patient No Fall Risks No Fall Risks Other (Comment) -  Risk for fall due to: Comment - - - pt tripped over the dishwasher door that was left open -  Follow up Falls evaluation completed;Education provided;Falls prevention discussed Falls evaluation completed;Falls prevention discussed - Falls prevention discussed Falls prevention discussed     Objective:  Jessica Dawson seemed alert and oriented and she participated  appropriately during our telephone visit.  Blood Pressure Weight BMI  BP Readings from Last 3 Encounters:  02/09/21 103/63  01/05/21 (!) 129/58  07/06/20 130/72   Wt Readings from Last 3 Encounters:  02/09/21 120 lb (54.4 kg)  01/05/21 114 lb (51.7 kg)  07/06/20 116 lb (52.6 kg)   BMI Readings from Last 1 Encounters:  02/09/21 25.08 kg/m    *Unable to obtain current vital signs, weight, and BMI due to telephone visit type  Hearing/Vision  Stanton Kidney did not seem to have difficulty with hearing/understanding during the telephone conversation Reports that she has had a formal eye exam by an eye care professional within the past year Reports that she has not had a formal hearing evaluation within the past year *Unable to fully assess hearing and vision during telephone visit type  Cognitive Function: 6CIT Screen 03/21/2021 09/02/2018  What Year? 0 points 0 points  What month? 0 points 0 points  What time? 0 points 0 points  Count back from 20 0 points 0 points  Months in reverse 0 points 0 points  Repeat phrase 0 points 2 points  Total Score 0 2   (Normal:0-7, Significant for Dysfunction: >8)  Normal Cognitive Function Screening: Yes   Immunization & Health Maintenance Record Immunization History  Administered Date(s) Administered   Fluad Quad(high Dose 65+) 11/26/2018, 01/06/2020, 01/05/2021   Influenza Split 11/27/2011   Influenza Whole 12/12/2007, 12/23/2008, 12/13/2009, 10/31/2010   Influenza, High Dose Seasonal PF 12/04/2016, 12/17/2017   Influenza,inj,Quad PF,6+ Mos 12/09/2012, 12/10/2013, 11/30/2014, 01/14/2015, 11/29/2015   Moderna Sars-Covid-2 Vaccination 07/31/2019, 08/29/2019, 10/20/2020   Pneumococcal Conjugate-13 08/24/2014   Pneumococcal Polysaccharide-23 10/30/2011   Tdap 10/31/2010, 09/27/2020   Zoster, Live 05/11/2012    Health Maintenance  Topic Date Due   COVID-19 Vaccine (4 - Booster for Moderna series) 01/22/2023 (Originally 12/15/2020)   Zoster  Vaccines- Shingrix (1 of 2) 04/08/2023 (Originally 05/25/1964)   TETANUS/TDAP  09/28/2030   Pneumonia Vaccine 52+ Years old  Completed   INFLUENZA VACCINE  Completed   DEXA SCAN  Completed   Hepatitis C Screening  Completed   HPV VACCINES  Aged Out   COLONOSCOPY (Pts 45-18yrs Insurance coverage will need to be confirmed)  Discontinued       Assessment  This is a routine wellness examination for Jessica Dawson.  Health Maintenance: Due or Overdue There are no  preventive care reminders to display for this patient.  Jessica Dawson does not need a referral for Community Assistance: Care Management:   no Social Work:    no Prescription Assistance:  no Nutrition/Diabetes Education:  no   Plan:  Personalized Goals  Goals Addressed               This Visit's Progress     Patient Stated (pt-stated)        03/21/2021 AWV Goal: Improved Nutrition/Diet  Patient will verbalize understanding that diet plays an important role in overall health and that a poor diet is a risk factor for many chronic medical conditions.  Over the next year, patient will improve self management of their diet by incorporating more water in her diet. Patient will utilize available community resources to help with food acquisition if needed (ex: food pantries, [address], etc) Patient will work with nutrition specialist if a referral was made        Personalized Health Maintenance & Screening Recommendations  Bone densitometry screening Shingles vaccine  Lung Cancer Screening Recommended: no (Low Dose CT Chest recommended if Age 77-80 years, 30 pack-year currently smoking OR have quit w/in past 15 years) Hepatitis C Screening recommended: no HIV Screening recommended: no  Advanced Directives: Written information was not prepared per patient's request.  Referrals & Orders Orders Placed This Encounter  Procedures   [place]    Follow-up Plan Follow-up with Jessica Marry, MD as planned Bone  density referral has been sent and they will call you to make that appointment.  Shingles vaccine can be done at any pharmacy. Patient did not want to schedule medicare wellness visit for next year at this time. Medicare wellness visit in one year. AVS printed and mailed to the patient.   I have personally reviewed and noted the following in the patients chart:   Medical and social history Use of alcohol, tobacco or illicit drugs  Current medications and supplements Functional ability and status Nutritional status Physical activity Advanced directives List of other physicians Hospitalizations, surgeries, and ER visits in previous 12 months Vitals Screenings to include cognitive, depression, and falls Referrals and appointments  In addition, I have reviewed and discussed with Jessica Dawson certain preventive protocols, quality metrics, and best practice recommendations. A written personalized care plan for preventive services as well as general preventive health recommendations is available and can be mailed to the patient at her request.      Tinnie Gens, RN  03/21/2021

## 2021-04-21 DIAGNOSIS — H52209 Unspecified astigmatism, unspecified eye: Secondary | ICD-10-CM | POA: Diagnosis not present

## 2021-04-21 DIAGNOSIS — H5213 Myopia, bilateral: Secondary | ICD-10-CM | POA: Diagnosis not present

## 2021-04-21 DIAGNOSIS — H524 Presbyopia: Secondary | ICD-10-CM | POA: Diagnosis not present

## 2021-05-25 ENCOUNTER — Other Ambulatory Visit: Payer: Self-pay | Admitting: Family Medicine

## 2021-05-25 DIAGNOSIS — E039 Hypothyroidism, unspecified: Secondary | ICD-10-CM

## 2021-06-01 ENCOUNTER — Other Ambulatory Visit: Payer: Medicare HMO

## 2021-06-07 ENCOUNTER — Other Ambulatory Visit: Payer: Self-pay | Admitting: Family Medicine

## 2021-06-29 ENCOUNTER — Ambulatory Visit (INDEPENDENT_AMBULATORY_CARE_PROVIDER_SITE_OTHER): Payer: Medicare HMO

## 2021-06-29 DIAGNOSIS — M810 Age-related osteoporosis without current pathological fracture: Secondary | ICD-10-CM | POA: Diagnosis not present

## 2021-06-29 DIAGNOSIS — Z780 Asymptomatic menopausal state: Secondary | ICD-10-CM | POA: Diagnosis not present

## 2021-06-29 DIAGNOSIS — Z0000 Encounter for general adult medical examination without abnormal findings: Secondary | ICD-10-CM

## 2021-06-29 NOTE — Progress Notes (Signed)
Call patient: Bone density test shows a T score of -3.4 which is considered osteoporosis. ? ? ?The current recommendation for osteoporosis treatment includes:  ? ?#1 calcium-total of 1200 mg of calcium daily.  If you eat a very calcium rich diet you may be able to obtain that without a supplement.  If not, then I recommend calcium 500 mg twice a day.  There are several products over-the-counter such as Caltrate D and Viactiv chews which are great options that contain calcium and vitamin D. ?#2 vitamin D-recommend 800 international units daily. ?#3 exercise-recommend 30 minutes of weightbearing exercise 3 days a week.  Resistance training ,such as doing bands and light weights, can be particularly helpful. ?#4 medication-if you are not currently on a bone builder, also called a bisphosphonate, then this has been shown to be very helpful in maintaining bone strength, preventing further thinning of the bones, and reducing your risk for fractures.  I would highly recommend that you consider starting 1 of these medications.  If you are okay with that then please let us know and we will send one to your pharmacy.  If you would like to discuss further we are happy to make an appointment for you so that we can go over options for treatment. ?

## 2021-07-04 ENCOUNTER — Ambulatory Visit (INDEPENDENT_AMBULATORY_CARE_PROVIDER_SITE_OTHER): Payer: Medicare HMO | Admitting: Family Medicine

## 2021-07-04 ENCOUNTER — Encounter: Payer: Self-pay | Admitting: Family Medicine

## 2021-07-04 VITALS — BP 138/78 | HR 69 | Ht <= 58 in | Wt 116.0 lb

## 2021-07-04 DIAGNOSIS — M810 Age-related osteoporosis without current pathological fracture: Secondary | ICD-10-CM | POA: Diagnosis not present

## 2021-07-04 DIAGNOSIS — Z9889 Other specified postprocedural states: Secondary | ICD-10-CM | POA: Diagnosis not present

## 2021-07-04 DIAGNOSIS — R413 Other amnesia: Secondary | ICD-10-CM

## 2021-07-04 DIAGNOSIS — I10 Essential (primary) hypertension: Secondary | ICD-10-CM | POA: Diagnosis not present

## 2021-07-04 DIAGNOSIS — M5416 Radiculopathy, lumbar region: Secondary | ICD-10-CM

## 2021-07-04 DIAGNOSIS — E039 Hypothyroidism, unspecified: Secondary | ICD-10-CM

## 2021-07-04 MED ORDER — GABAPENTIN 100 MG PO CAPS: 200.0000 mg | ORAL_CAPSULE | Freq: Two times a day (BID) | ORAL | 1 refills | Status: DC

## 2021-07-04 MED ORDER — ALENDRONATE SODIUM 70 MG PO TABS: 70.0000 mg | ORAL_TABLET | ORAL | 1 refills | Status: DC

## 2021-07-04 NOTE — Assessment & Plan Note (Signed)
Discussed recent DEXA results and recommend adequate calcium and vitamin D intake.  She is very nervous for the about taking a supplemental calcium as we discussed maybe trying to get it in through her diet if possible.  We also discussed the benefits of a bisphosphonate she is willing to try it but is a little nervous about it making her reflux worse.  We also discussed Trelegy's around weightbearing exercise including walking and resistance training. ?

## 2021-07-04 NOTE — Assessment & Plan Note (Signed)
Last TSH was a little too low so we will plan to check it again today.  She had where is having a lot of diarrhea around that time so I suspect with her weight loss that she was being slightly overmedicated. ?

## 2021-07-04 NOTE — Assessment & Plan Note (Signed)
I will be happy to take over the gabapentin but she really wants to work on tapering down so we discussed trying to drop to 200 mg instead of skipping a pill.  In that way she can gradually decrease her dose down to 100 mg twice a day and then go down to 1 tab daily.  But if she feels like it is helpful then I think we can certainly keep it we discussed that its not addictive and if it really is helpful for her pain and helps improve her sleep quality then I do think that is important. ?

## 2021-07-04 NOTE — Assessment & Plan Note (Addendum)
06/2021: MMSE score fo 28/30 today.  She missed the overlapping pentagons.  And missed appoint for the clinic floor. ?Plan to recheck in 6 to 12 months.  We will continue to monitor carefully. ?

## 2021-07-04 NOTE — Assessment & Plan Note (Signed)
Well controlled. Continue current regimen. Follow up in  6 mo  

## 2021-07-04 NOTE — Progress Notes (Signed)
? ?Established Patient Office Visit ? ?Subjective   ?Patient ID: Jessica Dawson, female    DOB: 1946-02-02  Age: 76 y.o. MRN: [id number] ? ?Chief Complaint  ?Patient presents with  ? Hypothyroidism  ? Hypertension  ? ? ?HPI ?Hypertension- Pt denies chest pain, SOB, dizziness, or heart palpitations.  Taking meds as directed w/o problems.  Denies medication side effects.   ? ?Her chronic low back pain she is currently on gabapentin which was previously written by her "back doctor" and would like me to take over the prescription.she is currently on gabapentin 300mg  BID.  She really wants to try to taper down the medication.  She says if she tries to just take it once a day then she does not sleep well.  She has been trying to skip a pill here they are just to see how she does without it. ? ?Hypothyroidism - Taking medication regularly in the AM away from food and vitamins, etc. No recent change to skin, hair, or energy levels. ? ?F/U memory change -no recent changes but she is due for the 48-month follow-up MMSE today. ? ?F/U osteoporosis.  DEXA scan came back with a T score -3.4 and she did have some questions about it.  She is a little nervous about starting calcium she is afraid it will cause some body aches.  She is okay with taking the vitamin D.  Also had some questions about weightbearing exercise ? ? ? ?ROS ? ?  ?Objective:  ?  ? ?BP 138/78   Pulse 69   Ht 4\' 10"  (1.473 m)   Wt 116 lb (52.6 kg)   SpO2 99%   BMI 24.24 kg/m?  ? ? ?Physical Exam ?Vitals and nursing note reviewed.  ?Constitutional:   ?   Appearance: She is well-developed.  ?HENT:  ?   Head: Normocephalic and atraumatic.  ?Cardiovascular:  ?   Rate and Rhythm: Normal rate and regular rhythm.  ?   Heart sounds: Normal heart sounds.  ?Pulmonary:  ?   Effort: Pulmonary effort is normal.  ?   Breath sounds: Normal breath sounds.  ?Skin: ?   General: Skin is warm and dry.  ?Neurological:  ?   Mental Status: She is alert and oriented to person, place,  and time.  ?Psychiatric:     ?   Behavior: Behavior normal.  ? ? ? ?No results found for any visits on 07/04/21. ? ? ? ?The 10-year ASCVD risk score (Arnett DK, et al., 2019) is: 31.7% ? ?  ?Assessment & Plan:  ? ?Problem List Items Addressed This Visit   ? ?  ? Cardiovascular and Mediastinum  ? ESSENTIAL HYPERTENSION, BENIGN - Primary  ?  Well controlled. Continue current regimen. Follow up in  6 mo  ? ?  ?  ? Relevant Orders  ? TSH  ? BASIC METABOLIC PANEL WITH GFR  ?  ? Endocrine  ? Hypothyroidism  ?  Last TSH was a little too low so we will plan to check it again today.  She had where is having a lot of diarrhea around that time so I suspect with her weight loss that she was being slightly overmedicated. ? ?  ?  ? Relevant Orders  ? TSH  ? BASIC METABOLIC PANEL WITH GFR  ?  ? Nervous and Auditory  ? Left lumbar radiculitis  ?  I will be happy to take over the gabapentin but she really wants to work on tapering down so  we discussed trying to drop to 200 mg instead of skipping a pill.  In that way she can gradually decrease her dose down to 100 mg twice a day and then go down to 1 tab daily.  But if she feels like it is helpful then I think we can certainly keep it we discussed that its not addictive and if it really is helpful for her pain and helps improve her sleep quality then I do think that is important. ? ?  ?  ? Relevant Medications  ? gabapentin (NEURONTIN) 100 MG capsule  ?  ? Musculoskeletal and Integument  ? Osteoporosis  ?  Discussed recent DEXA results and recommend adequate calcium and vitamin D intake.  She is very nervous for the about taking a supplemental calcium as we discussed maybe trying to get it in through her diet if possible.  We also discussed the benefits of a bisphosphonate she is willing to try it but is a little nervous about it making her reflux worse.  We also discussed Trelegy's around weightbearing exercise including walking and resistance training. ? ?  ?  ? Relevant  Medications  ? alendronate (FOSAMAX) 70 MG tablet  ?  ? Other  ? S/P lumbar laminectomy  ? Relevant Medications  ? gabapentin (NEURONTIN) 100 MG capsule  ? Memory change  ?  06/2021: MMSE score fo 28/30 today.  She missed the overlapping pentagons.  And missed appoint for the clinic floor. ?Plan to recheck in 6 to 12 months.  We will continue to monitor carefully. ?  ?  ? ? ?Return in about 6 months (around 01/03/2022) for thyroid and BP check and full labs .  ? ? ?Beatrice Lecher, MD ? ?

## 2021-07-05 LAB — BASIC METABOLIC PANEL WITH GFR
BUN/Creatinine Ratio: 20 (calc) (ref 6–22)
BUN: 21 mg/dL (ref 7–25)
CO2: 28 mmol/L (ref 20–32)
Calcium: 9.7 mg/dL (ref 8.6–10.4)
Chloride: 105 mmol/L (ref 98–110)
Creat: 1.03 mg/dL — ABNORMAL HIGH (ref 0.60–1.00)
Glucose, Bld: 86 mg/dL (ref 65–99)
Potassium: 4.8 mmol/L (ref 3.5–5.3)
Sodium: 140 mmol/L (ref 135–146)
eGFR: 56 mL/min/{1.73_m2} — ABNORMAL LOW (ref >=60)

## 2021-07-05 LAB — TSH: TSH: 1.86 mIU/L (ref 0.40–4.50)

## 2021-07-05 NOTE — Progress Notes (Signed)
Call patient: Kidney function stable at 1.0.  Electrolytes and thyroid look great.

## 2021-07-07 ENCOUNTER — Other Ambulatory Visit: Payer: Self-pay | Admitting: Family Medicine

## 2021-07-07 DIAGNOSIS — E039 Hypothyroidism, unspecified: Secondary | ICD-10-CM

## 2021-09-14 ENCOUNTER — Other Ambulatory Visit: Payer: Self-pay | Admitting: Family Medicine

## 2021-12-03 ENCOUNTER — Other Ambulatory Visit: Payer: Self-pay | Admitting: Family Medicine

## 2021-12-14 DIAGNOSIS — Z87891 Personal history of nicotine dependence: Secondary | ICD-10-CM | POA: Diagnosis not present

## 2021-12-14 DIAGNOSIS — Z008 Encounter for other general examination: Secondary | ICD-10-CM | POA: Diagnosis not present

## 2021-12-14 DIAGNOSIS — K08409 Partial loss of teeth, unspecified cause, unspecified class: Secondary | ICD-10-CM | POA: Diagnosis not present

## 2021-12-14 DIAGNOSIS — M1990 Unspecified osteoarthritis, unspecified site: Secondary | ICD-10-CM | POA: Diagnosis not present

## 2021-12-14 DIAGNOSIS — E039 Hypothyroidism, unspecified: Secondary | ICD-10-CM | POA: Diagnosis not present

## 2021-12-14 DIAGNOSIS — G629 Polyneuropathy, unspecified: Secondary | ICD-10-CM | POA: Diagnosis not present

## 2021-12-14 DIAGNOSIS — K219 Gastro-esophageal reflux disease without esophagitis: Secondary | ICD-10-CM | POA: Diagnosis not present

## 2021-12-14 DIAGNOSIS — Z888 Allergy status to other drugs, medicaments and biological substances status: Secondary | ICD-10-CM | POA: Diagnosis not present

## 2021-12-14 DIAGNOSIS — Z8249 Family history of ischemic heart disease and other diseases of the circulatory system: Secondary | ICD-10-CM | POA: Diagnosis not present

## 2021-12-14 DIAGNOSIS — I739 Peripheral vascular disease, unspecified: Secondary | ICD-10-CM | POA: Diagnosis not present

## 2021-12-14 DIAGNOSIS — I10 Essential (primary) hypertension: Secondary | ICD-10-CM | POA: Diagnosis not present

## 2021-12-28 ENCOUNTER — Telehealth: Payer: Self-pay | Admitting: Family Medicine

## 2021-12-28 ENCOUNTER — Other Ambulatory Visit: Payer: Self-pay | Admitting: Family Medicine

## 2021-12-28 DIAGNOSIS — E039 Hypothyroidism, unspecified: Secondary | ICD-10-CM

## 2021-12-28 NOTE — Telephone Encounter (Signed)
Call pt: received results of screen for peripheral arterial disease.  They did note some borderline results on the pressures.  For further evaluation I would recommend formal EBI testing at a vascular lab if she is okay with having that done more than happy to get her scheduled.

## 2021-12-29 NOTE — Telephone Encounter (Signed)
Left a vm msg for patient to return a call back to the clinic regarding peripheral arterial testing results. Direct call back info provided.

## 2022-01-03 ENCOUNTER — Ambulatory Visit: Payer: Medicare HMO | Admitting: Family Medicine

## 2022-01-03 NOTE — Progress Notes (Unsigned)
   Established Patient Office Visit  Subjective   Patient ID: Jessica Dawson, female    DOB: 1945/07/11  Age: 76 y.o. MRN: [id number]  No chief complaint on file.   HPI  Hypertension- Pt denies chest pain, SOB, dizziness, or heart palpitations.  Taking meds as directed w/o problems.  Denies medication side effects.    Hypothyroidism - Taking medication regularly in the AM away from food and vitamins, etc. No recent change to skin, hair, or energy levels.  Her husband recentlh passed away.   {History (Optional):23778}  ROS    Objective:     There were no vitals taken for this visit. {Vitals History (Optional):23777}  Physical Exam Vitals and nursing note reviewed.  Constitutional:      Appearance: She is well-developed.  HENT:     Head: Normocephalic and atraumatic.  Cardiovascular:     Rate and Rhythm: Normal rate and regular rhythm.     Heart sounds: Normal heart sounds.  Pulmonary:     Effort: Pulmonary effort is normal.     Breath sounds: Normal breath sounds.  Skin:    General: Skin is warm and dry.  Neurological:     Mental Status: She is alert and oriented to person, place, and time.  Psychiatric:        Behavior: Behavior normal.    No results found for any visits on 01/04/22.  {Labs (Optional):23779}  The 10-year ASCVD risk score (Arnett DK, et al., 2019) is: 31.7%    Assessment & Plan:   Problem List Items Addressed This Visit       Cardiovascular and Mediastinum   ESSENTIAL HYPERTENSION, BENIGN - Primary     Endocrine   Hypothyroidism   Other Visit Diagnoses     Grief           No follow-ups on file.    Beatrice Lecher, MD

## 2022-01-04 ENCOUNTER — Encounter: Payer: Self-pay | Admitting: Family Medicine

## 2022-01-04 ENCOUNTER — Ambulatory Visit (INDEPENDENT_AMBULATORY_CARE_PROVIDER_SITE_OTHER): Payer: Medicare HMO | Admitting: Family Medicine

## 2022-01-04 VITALS — BP 140/78 | HR 69 | Ht <= 58 in | Wt 113.0 lb

## 2022-01-04 DIAGNOSIS — E039 Hypothyroidism, unspecified: Secondary | ICD-10-CM

## 2022-01-04 DIAGNOSIS — F4321 Adjustment disorder with depressed mood: Secondary | ICD-10-CM

## 2022-01-04 DIAGNOSIS — M25562 Pain in left knee: Secondary | ICD-10-CM | POA: Diagnosis not present

## 2022-01-04 DIAGNOSIS — Z23 Encounter for immunization: Secondary | ICD-10-CM

## 2022-01-04 DIAGNOSIS — L659 Nonscarring hair loss, unspecified: Secondary | ICD-10-CM

## 2022-01-04 DIAGNOSIS — R69 Illness, unspecified: Secondary | ICD-10-CM | POA: Diagnosis not present

## 2022-01-04 DIAGNOSIS — I10 Essential (primary) hypertension: Secondary | ICD-10-CM

## 2022-01-04 NOTE — Assessment & Plan Note (Signed)
Check TSH.  She has had some hair loss but I suspect it is probably related to increased stress but will make sure that the thyroid is optimized.

## 2022-01-04 NOTE — Assessment & Plan Note (Addendum)
[place] at work currently did encourage her to reach out if any point she feels like she is struggling and we may need to get her referred for grief counseling.  He is staying active and engaged.

## 2022-01-04 NOTE — Assessment & Plan Note (Signed)
Well controlled. Continue current regimen. Follow up in  6 mo  

## 2022-01-05 LAB — COMPLETE METABOLIC PANEL WITH GFR
AG Ratio: 1.6 (calc) (ref 1.0–2.5)
ALT: 15 U/L (ref 6–29)
AST: 21 U/L (ref 10–35)
Albumin: 4.5 g/dL (ref 3.6–5.1)
Alkaline phosphatase (APISO): 78 U/L (ref 37–153)
BUN/Creatinine Ratio: 21 (calc) (ref 6–22)
BUN: 24 mg/dL (ref 7–25)
CO2: 25 mmol/L (ref 20–32)
Calcium: 10 mg/dL (ref 8.6–10.4)
Chloride: 107 mmol/L (ref 98–110)
Creat: 1.17 mg/dL — ABNORMAL HIGH (ref 0.60–1.00)
Globulin: 2.8 g/dL (calc) (ref 1.9–3.7)
Glucose, Bld: 90 mg/dL (ref 65–99)
Potassium: 5.2 mmol/L (ref 3.5–5.3)
Sodium: 141 mmol/L (ref 135–146)
Total Bilirubin: 0.5 mg/dL (ref 0.2–1.2)
Total Protein: 7.3 g/dL (ref 6.1–8.1)
eGFR: 48 mL/min/{1.73_m2} — ABNORMAL LOW (ref >=60)

## 2022-01-05 LAB — LIPID PANEL W/REFLEX DIRECT LDL
Cholesterol: 283 mg/dL — ABNORMAL HIGH (ref <200)
HDL: 55 mg/dL (ref >=50)
LDL Cholesterol (Calc): 195 mg/dL (calc) — ABNORMAL HIGH
Non-HDL Cholesterol (Calc): 228 mg/dL (calc) — ABNORMAL HIGH (ref <130)
Total CHOL/HDL Ratio: 5.1 (calc) — ABNORMAL HIGH (ref <5.0)
Triglycerides: 163 mg/dL — ABNORMAL HIGH (ref <150)

## 2022-01-05 LAB — CBC
HCT: 37.3 % (ref 35.0–45.0)
Hemoglobin: 12.3 g/dL (ref 11.7–15.5)
MCH: 30.5 pg (ref 27.0–33.0)
MCHC: 33 g/dL (ref 32.0–36.0)
MCV: 92.6 fL (ref 80.0–100.0)
MPV: 10.1 fL (ref 7.5–12.5)
Platelets: 314 10*3/uL (ref 140–400)
RBC: 4.03 10*6/uL (ref 3.80–5.10)
RDW: 12.4 % (ref 11.0–15.0)
WBC: 5.4 10*3/uL (ref 3.8–10.8)

## 2022-01-05 LAB — TSH: TSH: 0.97 mIU/L (ref 0.40–4.50)

## 2022-01-06 NOTE — Progress Notes (Signed)
Call patient: Triglycerides are just mildly elevated and LDL cholesterol is very high at 195.  Your risk for cardiovascular disease including heart attack and stroke is very high at 32%.  I would highly recommend a cholesterol-lowering medication at bedtime to reduce your risk.  Kidney function is stable.  Liver enzymes are normal.  Thyroid looks great.  No sign of anemia.  The 10-year ASCVD risk score (Arnett DK, et al., 2019) is: 32.6%   Values used to calculate the score:     Age: 76 years     Sex: Female     Is Non-Hispanic African American: No     Diabetic: No     Tobacco smoker: No     Systolic Blood Pressure: 240 mmHg     Is BP treated: Yes     HDL Cholesterol: 55 mg/dL     Total Cholesterol: 283 mg/dL

## 2022-01-09 NOTE — Progress Notes (Signed)
Pls restart medication.  Let us know if needs a refills.

## 2022-01-10 NOTE — Telephone Encounter (Signed)
Patient states she wants to hold off on this  for now as she has a large amount of bills after husband's death in 2022/11/07 unless it is urgent. Very worried about the cost of the procedure and insurance not covering 100% She would like to know if Dr. Madilyn Fireman felt this testing was urgent.?

## 2022-01-10 NOTE — Telephone Encounter (Signed)
Patient informed and will discuss with Dr. Madilyn Fireman further at her 4 mth appointment.

## 2022-01-27 ENCOUNTER — Other Ambulatory Visit: Payer: Self-pay | Admitting: Family Medicine

## 2022-01-27 DIAGNOSIS — E039 Hypothyroidism, unspecified: Secondary | ICD-10-CM

## 2022-02-21 DIAGNOSIS — H53001 Unspecified amblyopia, right eye: Secondary | ICD-10-CM | POA: Diagnosis not present

## 2022-02-21 DIAGNOSIS — H527 Unspecified disorder of refraction: Secondary | ICD-10-CM | POA: Diagnosis not present

## 2022-02-21 DIAGNOSIS — H353131 Nonexudative age-related macular degeneration, bilateral, early dry stage: Secondary | ICD-10-CM | POA: Diagnosis not present

## 2022-02-21 DIAGNOSIS — H40033 Anatomical narrow angle, bilateral: Secondary | ICD-10-CM | POA: Diagnosis not present

## 2022-02-21 DIAGNOSIS — H43813 Vitreous degeneration, bilateral: Secondary | ICD-10-CM | POA: Diagnosis not present

## 2022-02-21 DIAGNOSIS — H02403 Unspecified ptosis of bilateral eyelids: Secondary | ICD-10-CM | POA: Diagnosis not present

## 2022-02-21 DIAGNOSIS — Z961 Presence of intraocular lens: Secondary | ICD-10-CM | POA: Diagnosis not present

## 2022-02-21 DIAGNOSIS — H16223 Keratoconjunctivitis sicca, not specified as Sjogren's, bilateral: Secondary | ICD-10-CM | POA: Diagnosis not present

## 2022-02-21 DIAGNOSIS — H47323 Drusen of optic disc, bilateral: Secondary | ICD-10-CM | POA: Diagnosis not present

## 2022-03-12 ENCOUNTER — Other Ambulatory Visit: Payer: Self-pay | Admitting: Family Medicine

## 2022-03-12 DIAGNOSIS — E039 Hypothyroidism, unspecified: Secondary | ICD-10-CM

## 2022-03-27 ENCOUNTER — Other Ambulatory Visit: Payer: Self-pay | Admitting: Family Medicine

## 2022-04-17 ENCOUNTER — Other Ambulatory Visit: Payer: Self-pay | Admitting: Family Medicine

## 2022-04-17 DIAGNOSIS — E039 Hypothyroidism, unspecified: Secondary | ICD-10-CM

## 2022-05-02 ENCOUNTER — Ambulatory Visit: Payer: Self-pay | Admitting: Licensed Clinical Social Worker

## 2022-05-02 NOTE — Patient Outreach (Signed)
  Care Coordination  Initial Visit Note   05/02/2022 Name: Jessica Dawson MRN: [id number] DOB: [birth year]  Jessica Dawson is a 77 y.o. year old female who sees Metheney, Rene Kocher, MD for primary care. I spoke with  Garnet Koyanagi by phone today.  What matters to the patients health and wellness today?  Would like health education to assistance with managing HTN and lowering cholesterol     Goals Addressed             This Visit's Progress    COMPLETED: Connect with RN Care Manager to manager health concerns       Activities and task to complete in order to accomplish goals.   Continue with compliance of taking medication prescribed by Doctor You will have your initial appointment with Denton Brick to provide education to assist with managing your blood pressure and cholesterol         SDOH assessments and interventions completed:  Yes  SDOH Interventions Today    Flowsheet Row Most Recent Value  SDOH Interventions   Food Insecurity Interventions Intervention Not Indicated  Housing Interventions Intervention Not Indicated  Transportation Interventions Payor Benefit  [discussed payor transportation options]  Stress Interventions Intervention Not Indicated       Care Coordination Interventions:  Yes, provided  Interventions Today    Flowsheet Row Most Recent Value  Chronic Disease   Chronic disease during today's visit Hypertension (HTN)  General Interventions   General Interventions Discussed/Reviewed General Interventions Discussed, Referral to Nurse  [facility] Care Coordination program]  Education Interventions   Education Provided Provided Education  Mental Health Interventions   Mental Health Discussed/Reviewed Mental Health Discussed, Grief and Loss  [declined]       Follow up plan: No further intervention required by LCSW, no needs identified  Referral made to RN care manager  Encounter Outcome:  Pt. Visit Completed   Casimer Lanius, [place] [phone]

## 2022-05-02 NOTE — Patient Instructions (Signed)
Visit Information  Thank you for taking time to visit with me today. Please don't hesitate to contact me if I can be of assistance to you.   Following are the goals we discussed today:   Goals Addressed             This Visit's Progress    COMPLETED: Connect with RN Care Manager to manager health concerns       Activities and task to complete in order to accomplish goals.   Continue with compliance of taking medication prescribed by Doctor You will have your initial appointment with Denton Brick to provide education to assist with managing your blood pressure and cholesterol          Your next appointment is by telephone on 05/15/22 at 10:30 with RN care manager  Please call the care guide team at [phone] if you need to cancel or reschedule your appointment.    The patient verbalized understanding of instructions, educational materials, and care plan provided today and DECLINED offer to receive copy of patient instructions, educational materials, and care plan.   No further follow up required: by social work  Jessica Dawson, [place] [phone]

## 2022-05-09 ENCOUNTER — Other Ambulatory Visit: Payer: Self-pay | Admitting: Family Medicine

## 2022-05-09 DIAGNOSIS — M5416 Radiculopathy, lumbar region: Secondary | ICD-10-CM

## 2022-05-09 DIAGNOSIS — Z9889 Other specified postprocedural states: Secondary | ICD-10-CM

## 2022-05-11 ENCOUNTER — Ambulatory Visit (INDEPENDENT_AMBULATORY_CARE_PROVIDER_SITE_OTHER): Payer: Medicare HMO | Admitting: Family Medicine

## 2022-05-11 ENCOUNTER — Encounter: Payer: Self-pay | Admitting: Family Medicine

## 2022-05-11 VITALS — BP 132/68 | HR 64 | Ht <= 58 in | Wt 113.0 lb

## 2022-05-11 DIAGNOSIS — R2231 Localized swelling, mass and lump, right upper limb: Secondary | ICD-10-CM | POA: Diagnosis not present

## 2022-05-11 DIAGNOSIS — M2041 Other hammer toe(s) (acquired), right foot: Secondary | ICD-10-CM | POA: Diagnosis not present

## 2022-05-11 DIAGNOSIS — J029 Acute pharyngitis, unspecified: Secondary | ICD-10-CM | POA: Diagnosis not present

## 2022-05-11 DIAGNOSIS — E7800 Pure hypercholesterolemia, unspecified: Secondary | ICD-10-CM

## 2022-05-11 DIAGNOSIS — I10 Essential (primary) hypertension: Secondary | ICD-10-CM | POA: Diagnosis not present

## 2022-05-11 MED ORDER — TELMISARTAN 40 MG PO TABS: 40.0000 mg | ORAL_TABLET | Freq: Every day | ORAL | 1 refills | Status: DC

## 2022-05-11 NOTE — Progress Notes (Addendum)
Established Patient Office Visit  Subjective   Patient ID: Jessica Dawson, female    DOB: 10-29-1945  Age: 77 y.o. MRN: [id number]  Chief Complaint  Patient presents with   Follow-up    HPI  Hypertension- Pt denies chest pain, SOB, dizziness, or heart palpitations.  Taking meds as directed w/o problems.  Denies medication side effects.  And home blood pressures as well.  Home blood pressures are in the 123XX123 and 0000000 systolic. Currently takes half a tab of '80mg'$  Micardis.    Had a blood draw in October and now has a knot there. She was told it would go away.  Still feels tender occasionally.  She also has a sore throat today.  She says last week she started getting a burning sensation in her nose and a sore throat.  And it may be just a little bit of a runny nose but no other symptoms no cough fever chills etc.  She basically did some salt water gargles and Chloraseptic spray and it eventually got better but she is just noticing today that the sore throat feels like it is coming back she says it feels more like it scratchy.  He also has a hammertoe and bunions on her right foot and wants to know if there is something that she could do to help with the hammertoe that is not surgery.  Hyperlipidemia-she has been able to take half a tab of her statin about twice a week so far that is what she has been able to tolerate she was not able to tolerate a whole tab.    ROS    Objective:     BP 132/68   Pulse 64   Ht '4\' 10"'$  (1.473 m)   Wt 113 lb 0.6 oz (51.3 kg)   SpO2 100%   BMI 23.63 kg/m    Physical Exam Vitals and nursing note reviewed.  Constitutional:      Appearance: She is well-developed.  HENT:     Head: Normocephalic and atraumatic.  Cardiovascular:     Rate and Rhythm: Normal rate and regular rhythm.     Heart sounds: Normal heart sounds.  Pulmonary:     Effort: Pulmonary effort is normal.     Breath sounds: Normal breath sounds.  Musculoskeletal:     Comments: Second  toe on right foot is a hammertoe.  She does have bunions on both feet.  Skin:    General: Skin is warm and dry.     Comments: soft mass on right ant forearm near the antecubital fossa.    Neurological:     Mental Status: She is alert and oriented to person, place, and time.  Psychiatric:        Behavior: Behavior normal.      No results found for any visits on 05/11/22.    The 10-year ASCVD risk score (Arnett DK, et al., 2019) is: 22.8%    Assessment & Plan:   Problem List Items Addressed This Visit       Cardiovascular and Mediastinum   Essential hypertension, benign - Primary    History here is elevated will recheck before she goes home blood pressures are actually running low. Return in 2 weeks with your home blood pressure cuff so that we can check it compared to our machine I am concerned that it might be running a little bit on the low end.      Relevant Medications   telmisartan (MICARDIS) 40 MG tablet (Start  on 08/12/2022)     Other   Hyperlipidemia   Relevant Medications   telmisartan (MICARDIS) 40 MG tablet (Start on 08/12/2022)   Other Relevant Orders   Lipid Panel w/reflex Direct LDL   COMPLETE METABOLIC PANEL WITH GFR   Other Visit Diagnoses     Sore throat       Arm mass, right       Relevant Orders   [place] RT UPPER EXTREM LTD SOFT TISSUE NON VASCULAR   Hammer toe of right foot          Sore Throat-continue with cough salt water gargles if it gets worse then please let us know.  Mass on right arm-will schedule for ultrasound for further evaluation.   Hammertoe-there are several toe supports on [employer] that are in the $12 range that would be very reasonable for her to try.  She can find one that she feels is comfortable to wear.  We can always consider podiatry referral as well she is just not interested in any type of surgical correction.  No follow-ups on file.    Beatrice Lecher, MD

## 2022-05-11 NOTE — Assessment & Plan Note (Signed)
History here is elevated will recheck before she goes home blood pressures are actually running low. Return in 2 weeks with your home blood pressure cuff so that we can check it compared to our machine I am concerned that it might be running a little bit on the low end.

## 2022-05-11 NOTE — Patient Instructions (Signed)
Return in 2 weeks with your home blood pressure cuff so that we can check it compared to our machine I am concerned that it might be running a little bit on the low end.  You can have your son go on [place] and type in hammertoe corrector or hammertoe and should come up with a couple of padded solutions.

## 2022-05-11 NOTE — Addendum Note (Signed)
Addended by: Beatrice Lecher D on: 05/11/2022 12:21 PM   Modules accepted: Orders

## 2022-05-12 ENCOUNTER — Telehealth: Payer: Self-pay

## 2022-05-12 ENCOUNTER — Other Ambulatory Visit: Payer: Self-pay

## 2022-05-12 LAB — COMPLETE METABOLIC PANEL WITH GFR
AG Ratio: 1.5 (calc) (ref 1.0–2.5)
ALT: 17 U/L (ref 6–29)
AST: 20 U/L (ref 10–35)
Albumin: 4.5 g/dL (ref 3.6–5.1)
Alkaline phosphatase (APISO): 80 U/L (ref 37–153)
BUN/Creatinine Ratio: 24 (calc) — ABNORMAL HIGH (ref 6–22)
BUN: 26 mg/dL — ABNORMAL HIGH (ref 7–25)
CO2: 25 mmol/L (ref 20–32)
Calcium: 9.6 mg/dL (ref 8.6–10.4)
Chloride: 104 mmol/L (ref 98–110)
Creat: 1.08 mg/dL — ABNORMAL HIGH (ref 0.60–1.00)
Globulin: 3.1 g/dL (calc) (ref 1.9–3.7)
Glucose, Bld: 93 mg/dL (ref 65–99)
Potassium: 4.5 mmol/L (ref 3.5–5.3)
Sodium: 139 mmol/L (ref 135–146)
Total Bilirubin: 0.4 mg/dL (ref 0.2–1.2)
Total Protein: 7.6 g/dL (ref 6.1–8.1)
eGFR: 53 mL/min/{1.73_m2} — ABNORMAL LOW (ref >=60)

## 2022-05-12 LAB — LIPID PANEL W/REFLEX DIRECT LDL
Cholesterol: 310 mg/dL — ABNORMAL HIGH (ref <200)
HDL: 64 mg/dL (ref >=50)
LDL Cholesterol (Calc): 215 mg/dL (calc) — ABNORMAL HIGH
Non-HDL Cholesterol (Calc): 246 mg/dL (calc) — ABNORMAL HIGH (ref <130)
Total CHOL/HDL Ratio: 4.8 (calc) (ref <5.0)
Triglycerides: 148 mg/dL (ref <150)

## 2022-05-12 NOTE — Progress Notes (Signed)
Okay, I am okay with her continuing her current regimen.  I think we need to add it back to her medication list because I looked at her med list and thought that maybe she had stopped it completely because it was not on their.  So that is why did not realize she was still using it regularly.  We just need to document how she is taking it so if it is twice a week that is fine.  But absolutely if she can try 3 times a week that would be wonderful.  She can always go back down to 2 if needed.

## 2022-05-12 NOTE — Progress Notes (Signed)
Jessica Dawson, your cholesterol is jumped up again back over 200.  I would still highly recommend a cholesterol-lowering medication.  If you want to avoid a statin we could always try Zetia it is not a statin it is totally in a different category and is very well-tolerated.  If you are open to that we can certainly try it.  Kidney function is elevated but stable.

## 2022-05-12 NOTE — Patient Outreach (Signed)
Jessica Dawson called wanting to cancel appointment on Monday March 4 with Nurse Case Manager Thea Silversmith.   She stated she feels like she is getting what she is needing from her Primary Care Physician.    [facility], Rosa Sanchez, [place] [facility] [phone]

## 2022-05-25 ENCOUNTER — Ambulatory Visit (INDEPENDENT_AMBULATORY_CARE_PROVIDER_SITE_OTHER): Payer: Medicare HMO

## 2022-05-25 ENCOUNTER — Ambulatory Visit (INDEPENDENT_AMBULATORY_CARE_PROVIDER_SITE_OTHER): Payer: Medicare HMO | Admitting: Family Medicine

## 2022-05-25 DIAGNOSIS — I10 Essential (primary) hypertension: Secondary | ICD-10-CM

## 2022-05-25 DIAGNOSIS — R2231 Localized swelling, mass and lump, right upper limb: Secondary | ICD-10-CM

## 2022-05-25 MED ORDER — TELMISARTAN 20 MG PO TABS: 20.0000 mg | ORAL_TABLET | Freq: Every day | ORAL | 1 refills | Status: DC

## 2022-05-25 NOTE — Progress Notes (Signed)
   Subjective:    Patient ID: Jessica Dawson, female    DOB: 02/23/1946, 77 y.o.   MRN: [id number]  HPI She is here today for a blood pressure check. Her last BP was 132/68. She complains of mild SOB with activity. She denies headache,  dizziness, visual changes, lower extremity swelling or CP.    Review of Systems     Objective:   Physical Exam        Assessment & Plan:  Her blood pressure on her home monitor was 133/81. Her first BP in office was 138/67, 71 HR. Her 2nd reading was She has also brought her home readings, which will be scanned into EPIC. She is to take 1/2 tab of Telmisartan 40 mg tab due to low BP readings.

## 2022-05-25 NOTE — Progress Notes (Signed)
Hypertension-her home blood pressure cuff is actually pretty accurate only just a few points from our blood pressure reading here.  And she has been getting a lot of pressures in the 90s and even 123XX123 systolic at home.  This makes me very concerned that she is actually having hypotensive episodes.  So we will get a decrease to 10 losartan to 20 mg.  She can split her current 40 mg dose and then fill the 20s when she is able.

## 2022-06-02 NOTE — Progress Notes (Signed)
Call patient: The lump on her arm they felt was a pocket of fluid.  They said it could just be a hematoma which is like a blood pocket from a bruise or trauma versus a possible abscess but I do not remember her having any fever or increased warmth over the area.  Normally a hematoma would go away within a month so the fact that it has been there for at least 5 months we could consider getting you in with Dr. Darene Lamer to drain the area and send it for culture potentially.  Feel like it is gotten smaller over time?  If so then we can just continue to monitor it.  But if you feel like it is about the same or may be even larger than that could be our next step.

## 2022-06-15 ENCOUNTER — Other Ambulatory Visit: Payer: Self-pay | Admitting: Family Medicine

## 2022-06-15 DIAGNOSIS — E039 Hypothyroidism, unspecified: Secondary | ICD-10-CM

## 2022-07-11 ENCOUNTER — Other Ambulatory Visit: Payer: Self-pay | Admitting: Family Medicine

## 2022-07-11 DIAGNOSIS — E039 Hypothyroidism, unspecified: Secondary | ICD-10-CM

## 2022-08-03 ENCOUNTER — Ambulatory Visit (INDEPENDENT_AMBULATORY_CARE_PROVIDER_SITE_OTHER): Payer: Medicare HMO | Admitting: Family Medicine

## 2022-08-03 ENCOUNTER — Encounter: Payer: Self-pay | Admitting: Family Medicine

## 2022-08-03 VITALS — BP 126/68 | HR 65 | Ht <= 58 in | Wt 113.0 lb

## 2022-08-03 DIAGNOSIS — E039 Hypothyroidism, unspecified: Secondary | ICD-10-CM | POA: Diagnosis not present

## 2022-08-03 DIAGNOSIS — M79602 Pain in left arm: Secondary | ICD-10-CM

## 2022-08-03 DIAGNOSIS — I959 Hypotension, unspecified: Secondary | ICD-10-CM

## 2022-08-03 DIAGNOSIS — I10 Essential (primary) hypertension: Secondary | ICD-10-CM | POA: Diagnosis not present

## 2022-08-03 DIAGNOSIS — M79601 Pain in right arm: Secondary | ICD-10-CM | POA: Diagnosis not present

## 2022-08-03 NOTE — Patient Instructions (Signed)
Stop the micardis monitor blood pressure over the next 2 weeks.  If you are still seeing blood pressures under 100 then okay to liberalize salt in your diet.  Make sure you are continuing to work on hydrating well.  If blood pressures are still continuing to stay low, less than 100, then please let me know.

## 2022-08-03 NOTE — Progress Notes (Signed)
   Established Patient Office Visit  Subjective   Patient ID: Jessica Dawson, female    DOB: 23-Nov-1945  Age: 77 y.o. MRN: [id number]  Chief Complaint  Patient presents with   Hypertension    HPI Reports blood pressures have been dropping first thing in the morning. Started in March, Brought in a home log for this month.   Has been splitting  Micardis in half. Getting some SOB when her BP drops in the 70s.  Then started quartering the medication.  Denies palpitations or tachycardia.  No extremity swelling.  No headaches.  No chest pain.   Aslo reports neck and arms have been achy.      ROS    Objective:     BP 126/68   Pulse 65   Ht 4\' 10"  (1.473 m)   Wt 113 lb (51.3 kg)   SpO2 99%   BMI 23.62 kg/m    Physical Exam Vitals and nursing note reviewed.  Constitutional:      Appearance: She is well-developed.  HENT:     Head: Normocephalic and atraumatic.  Eyes:     Conjunctiva/sclera: Conjunctivae normal.  Cardiovascular:     Rate and Rhythm: Normal rate and regular rhythm.     Heart sounds: Normal heart sounds.  Pulmonary:     Effort: Pulmonary effort is normal.     Breath sounds: Normal breath sounds.  Skin:    General: Skin is warm and dry.     Coloration: Skin is not pale.  Neurological:     Mental Status: She is alert and oriented to person, place, and time.  Psychiatric:        Behavior: Behavior normal.      No results found for any visits on 08/03/22.    The 10-year ASCVD risk score (Arnett DK, et al., 2019) is: 23.4%    Assessment & Plan:   Problem List Items Addressed This Visit       Cardiovascular and Mediastinum   Essential hypertension, benign - Primary   Relevant Orders   COMPLETE METABOLIC PANEL WITH GFR     Endocrine   Hypothyroidism    Will recheck TSH since BP has been off.       Relevant Orders   TSH   COMPLETE METABOLIC PANEL WITH GFR   CBC with Differential/Platelet   Other Visit Diagnoses     Hypotension,  unspecified hypotension type       Relevant Orders   TSH   COMPLETE METABOLIC PANEL WITH GFR   CBC with Differential/Platelet   Pain in both upper extremities            Hypotension - Stop the micardis monitor blood pressure over the next 2 weeks.  If you are still seeing blood pressures under 100 then okay to liberalize salt in your diet.  Make sure you are continuing to work on hydrating well.  If blood pressures are still continuing to stay low, less than 100, then please let me know.  Heart is in rhythm today on exam.  Bilat arm achiness-will see if this improves as her blood pressure improves but if not we can address further.   Return in about 4 weeks (around 08/31/2022) for Hypertension.    [name], MD

## 2022-08-03 NOTE — Assessment & Plan Note (Signed)
Will recheck TSH since BP has been off.

## 2022-08-04 LAB — CBC WITH DIFFERENTIAL/PLATELET
Absolute Monocytes: 566 cells/uL (ref 200–950)
Basophils Absolute: 71 cells/uL (ref 0–200)
Basophils Relative: 1.2 %
Eosinophils Absolute: 224 cells/uL (ref 15–500)
Eosinophils Relative: 3.8 %
HCT: 37.2 % (ref 35.0–45.0)
Hemoglobin: 12.2 g/dL (ref 11.7–15.5)
Lymphs Abs: 1351 cells/uL (ref 850–3900)
MCH: 30.1 pg (ref 27.0–33.0)
MCHC: 32.8 g/dL (ref 32.0–36.0)
MCV: 91.9 fL (ref 80.0–100.0)
MPV: 10 fL (ref 7.5–12.5)
Monocytes Relative: 9.6 %
Neutro Abs: 3688 cells/uL (ref 1500–7800)
Neutrophils Relative %: 62.5 %
Platelets: 317 10*3/uL (ref 140–400)
RBC: 4.05 10*6/uL (ref 3.80–5.10)
RDW: 12.5 % (ref 11.0–15.0)
Total Lymphocyte: 22.9 %
WBC: 5.9 10*3/uL (ref 3.8–10.8)

## 2022-08-04 LAB — COMPLETE METABOLIC PANEL WITH GFR
AG Ratio: 1.7 (calc) (ref 1.0–2.5)
ALT: 17 U/L (ref 6–29)
AST: 24 U/L (ref 10–35)
Albumin: 4.8 g/dL (ref 3.6–5.1)
Alkaline phosphatase (APISO): 89 U/L (ref 37–153)
BUN/Creatinine Ratio: 24 (calc) — ABNORMAL HIGH (ref 6–22)
BUN: 27 mg/dL — ABNORMAL HIGH (ref 7–25)
CO2: 25 mmol/L (ref 20–32)
Calcium: 9.6 mg/dL (ref 8.6–10.4)
Chloride: 105 mmol/L (ref 98–110)
Creat: 1.13 mg/dL — ABNORMAL HIGH (ref 0.60–1.00)
Globulin: 2.8 g/dL (calc) (ref 1.9–3.7)
Glucose, Bld: 91 mg/dL (ref 65–99)
Potassium: 5.5 mmol/L — ABNORMAL HIGH (ref 3.5–5.3)
Sodium: 139 mmol/L (ref 135–146)
Total Bilirubin: 0.4 mg/dL (ref 0.2–1.2)
Total Protein: 7.6 g/dL (ref 6.1–8.1)
eGFR: 49 mL/min/{1.73_m2} — ABNORMAL LOW (ref >=60)

## 2022-08-04 LAB — TSH: TSH: 1.27 mIU/L (ref 0.40–4.50)

## 2022-08-04 NOTE — Progress Notes (Signed)
Call patient: Kidney function is stable.  She still looks a little dry on blood work so just encouraged her to continue to work on hydration.  I know that was something that she was trying to put some attention to especially with the low blood pressures.  And her urine reflects that she does look dry.  Seems a little bit elevated as well so I want a recheck that in a week.  Thyroid looks good.  Blood count looks good.  No sign of anemia or infection.  Hopefully just stopping the medication will make a difference.

## 2022-09-14 ENCOUNTER — Other Ambulatory Visit: Payer: Self-pay | Admitting: Family Medicine

## 2022-09-14 DIAGNOSIS — Z9889 Other specified postprocedural states: Secondary | ICD-10-CM

## 2022-09-14 DIAGNOSIS — M5416 Radiculopathy, lumbar region: Secondary | ICD-10-CM

## 2022-09-16 ENCOUNTER — Other Ambulatory Visit: Payer: Self-pay | Admitting: Family Medicine

## 2022-09-21 ENCOUNTER — Encounter: Payer: Self-pay | Admitting: Family Medicine

## 2022-09-21 ENCOUNTER — Ambulatory Visit: Payer: Medicare HMO | Admitting: Family Medicine

## 2022-09-21 VITALS — BP 128/76 | HR 62 | Ht <= 58 in | Wt 111.0 lb

## 2022-09-21 DIAGNOSIS — R42 Dizziness and giddiness: Secondary | ICD-10-CM

## 2022-09-21 DIAGNOSIS — R2231 Localized swelling, mass and lump, right upper limb: Secondary | ICD-10-CM | POA: Diagnosis not present

## 2022-09-21 DIAGNOSIS — I959 Hypotension, unspecified: Secondary | ICD-10-CM | POA: Diagnosis not present

## 2022-09-21 DIAGNOSIS — E039 Hypothyroidism, unspecified: Secondary | ICD-10-CM

## 2022-09-21 MED ORDER — LEVOTHYROXINE SODIUM 88 MCG PO TABS
88.0000 ug | ORAL_TABLET | Freq: Every day | ORAL | 1 refills | Status: DC
Start: 2022-09-21 — End: 2023-08-10

## 2022-09-21 NOTE — Progress Notes (Addendum)
Established Patient Office Visit  Subjective   Patient ID: Jessica Dawson, female    DOB: 17-Sep-1945  Age: 77 y.o. MRN: [id number]  Chief Complaint  Patient presents with   Hypertension    HPI  Here for follow-up low blood pressure when she was here before she was having systolics in the 70s and feeling pretty poorly.  So we actually stopped her blood pressure pill and asked her to keep a log at home she did bring that in with her.  Most of her blood pressures look good though she still had a few low blood pressures with systolics in the 80s and 90s but she feels much better now she is off the medication.  She has been working out in the yard she does try to hydrate well.  She still having some right elbow pain she has been using a topical mentholated product and Tylenol.  She still has the lump that we did an ultrasound on back in March showing a complex fluid collection in the antecubital fossa.  It really has not gone away.  But there is no sign of infection to indicate abscess.  I most wonder if it might be coming from the joint itself.  We discussed possibly getting in with Dr. Karie Schwalbe or sports med doc for further evaluation.  Said it really flared up when she used her push mower and then that evening could barely wash dishes because of the discomfort.  So wanted to let me know that she had a mild episode of vertigo that occurred this morning she woke up around 5 AM and felt dizzy and noticed she was having a hard time focusing on the time on the clock so she laid back down and went back to sleep and by the time she got up she felt fine.  She has had vertigo before.    ROS    Objective:     BP 128/76   Pulse 62   Ht 4\' 10"  (1.473 m)   Wt 111 lb (50.3 kg)   SpO2 100%   BMI 23.20 kg/m    Physical Exam Vitals and nursing note reviewed.  Constitutional:      Appearance: She is well-developed.  HENT:     Head: Normocephalic and atraumatic.  Cardiovascular:     Rate and Rhythm:  Normal rate and regular rhythm.     Heart sounds: Normal heart sounds.  Pulmonary:     Effort: Pulmonary effort is normal.     Breath sounds: Normal breath sounds.  Skin:    General: Skin is warm and dry.  Neurological:     Mental Status: She is alert and oriented to person, place, and time.  Psychiatric:        Behavior: Behavior normal.      No results found for any visits on 09/21/22.    The 10-year ASCVD risk score (Arnett DK, et al., 2019) is: 24.1%    Assessment & Plan:   Problem List Items Addressed This Visit       Cardiovascular and Mediastinum   Hypotension - Primary    This point she no longer has a diagnosis of hypertension.  In fact her blood pressures are still running occasionally low even off of medication.  Just encouraged her to make sure she is hydrating well if it drops low and she can tell make sure getting fluid and salt and to try to bring it back up.  And will continue to monitor.  She is up-to-date on her labs.  Will move follow-up appointment from August out to October so that we can give her her flu shot at the same time.        Endocrine   Hypothyroidism   Relevant Medications   levothyroxine (SYNTHROID) 88 MCG tablet   Other Visit Diagnoses     Arm mass, right       Vertigo          Jessica Dawson is feeling fine right now but we did discuss that if her symptoms keep returning or persist that I am happy to refer her to vestibular rehab for further treatment.  Right arm pain-recommend follow-up with Dr. Karie Schwalbe at her convenience.  No follow-ups on file.    Jessica Gasser, MD

## 2022-09-21 NOTE — Patient Instructions (Signed)
Ok to move August to October

## 2022-09-21 NOTE — Assessment & Plan Note (Signed)
This point she no longer has a diagnosis of hypertension.  In fact her blood pressures are still running occasionally low even off of medication.  Just encouraged her to make sure she is hydrating well if it drops low and she can tell make sure getting fluid and salt and to try to bring it back up.  And will continue to monitor.  She is up-to-date on her labs.  Will move follow-up appointment from August out to October so that we can give her her flu shot at the same time.

## 2022-09-22 NOTE — Addendum Note (Signed)
Addended by: [name] D on: 09/22/2022 10:19 AM   Modules accepted: Level of Service

## 2022-10-25 ENCOUNTER — Telehealth: Payer: Self-pay | Admitting: Family Medicine

## 2022-10-25 NOTE — Telephone Encounter (Signed)
Please call patient and find out what happened with the alendronate we had written that for her osteoporosis it was a pill taken weekly back in 2023 I believe.  I did not have any documentation for why it was discontinued but she does have very thin bones and is at risk for hip fracture and so if she did not tolerate that medication we can look at alternative options such as Prolia.

## 2022-10-26 NOTE — Telephone Encounter (Signed)
Left message for a return call

## 2022-10-31 NOTE — Telephone Encounter (Signed)
Per patient - she never took the alendronate rx due to the many side effects affiliated with the rx. Patient was informed of the provider's recommendation regarding the Prolia medication as an option. However, patient prefers to discuss any alternative options during her appointment in October. When asked about taking a daily supplementation of vitamin D and calcium, patient stated she is not taking anything for bone support at this time.

## 2022-11-02 DIAGNOSIS — K219 Gastro-esophageal reflux disease without esophagitis: Secondary | ICD-10-CM | POA: Diagnosis not present

## 2022-11-02 DIAGNOSIS — N189 Chronic kidney disease, unspecified: Secondary | ICD-10-CM | POA: Diagnosis not present

## 2022-11-02 DIAGNOSIS — M5430 Sciatica, unspecified side: Secondary | ICD-10-CM | POA: Diagnosis not present

## 2022-11-02 DIAGNOSIS — E039 Hypothyroidism, unspecified: Secondary | ICD-10-CM | POA: Diagnosis not present

## 2022-11-02 DIAGNOSIS — Z008 Encounter for other general examination: Secondary | ICD-10-CM | POA: Diagnosis not present

## 2022-11-02 DIAGNOSIS — E785 Hyperlipidemia, unspecified: Secondary | ICD-10-CM | POA: Diagnosis not present

## 2022-11-02 DIAGNOSIS — M1990 Unspecified osteoarthritis, unspecified site: Secondary | ICD-10-CM | POA: Diagnosis not present

## 2022-11-02 DIAGNOSIS — I739 Peripheral vascular disease, unspecified: Secondary | ICD-10-CM | POA: Diagnosis not present

## 2022-11-02 DIAGNOSIS — M810 Age-related osteoporosis without current pathological fracture: Secondary | ICD-10-CM | POA: Diagnosis not present

## 2022-11-02 DIAGNOSIS — G629 Polyneuropathy, unspecified: Secondary | ICD-10-CM | POA: Diagnosis not present

## 2022-11-02 DIAGNOSIS — I129 Hypertensive chronic kidney disease with stage 1 through stage 4 chronic kidney disease, or unspecified chronic kidney disease: Secondary | ICD-10-CM | POA: Diagnosis not present

## 2022-11-02 DIAGNOSIS — Z8249 Family history of ischemic heart disease and other diseases of the circulatory system: Secondary | ICD-10-CM | POA: Diagnosis not present

## 2022-11-02 DIAGNOSIS — M4800 Spinal stenosis, site unspecified: Secondary | ICD-10-CM | POA: Diagnosis not present

## 2022-11-09 ENCOUNTER — Ambulatory Visit: Payer: Medicare HMO | Admitting: Family Medicine

## 2022-12-26 ENCOUNTER — Other Ambulatory Visit: Payer: Self-pay | Admitting: Family Medicine

## 2022-12-26 DIAGNOSIS — M5416 Radiculopathy, lumbar region: Secondary | ICD-10-CM

## 2022-12-26 DIAGNOSIS — Z9889 Other specified postprocedural states: Secondary | ICD-10-CM

## 2022-12-29 DIAGNOSIS — N813 Complete uterovaginal prolapse: Secondary | ICD-10-CM | POA: Diagnosis not present

## 2023-01-04 ENCOUNTER — Ambulatory Visit (INDEPENDENT_AMBULATORY_CARE_PROVIDER_SITE_OTHER): Payer: Medicare HMO | Admitting: Family Medicine

## 2023-01-04 VITALS — BP 129/76 | HR 63 | Ht <= 58 in | Wt 111.0 lb

## 2023-01-04 DIAGNOSIS — E7800 Pure hypercholesterolemia, unspecified: Secondary | ICD-10-CM

## 2023-01-04 DIAGNOSIS — D171 Benign lipomatous neoplasm of skin and subcutaneous tissue of trunk: Secondary | ICD-10-CM | POA: Diagnosis not present

## 2023-01-04 DIAGNOSIS — E039 Hypothyroidism, unspecified: Secondary | ICD-10-CM | POA: Diagnosis not present

## 2023-01-04 DIAGNOSIS — R0789 Other chest pain: Secondary | ICD-10-CM

## 2023-01-04 DIAGNOSIS — Z23 Encounter for immunization: Secondary | ICD-10-CM

## 2023-01-04 MED ORDER — LOVASTATIN 10 MG PO TABS
10.0000 mg | ORAL_TABLET | ORAL | 0 refills | Status: AC
Start: 2023-01-04 — End: ?

## 2023-01-04 NOTE — Progress Notes (Signed)
Established Patient Office Visit  Subjective   Patient ID: Jessica Dawson, female    DOB: May 11, 1945  Age: 77 y.o. MRN: [id number]  Chief Complaint  Patient presents with   Hypertension   Hyperlipidemia    HPI   Hypertension- Pt denies chest pain, SOB, dizziness, or heart palpitations.  Taking meds as directed w/o problems.  Denies medication side effects.    Hyperlipidemia - currently cutting simvastatin 1/2 tab every 3 days and still having some body aches.    She also reports that a few nights ago she woke up early in the morning with significant chest pain across her mid upper chest.  She said she eventually just got up and ended up taking a Prilosec it eventually eased off it has not bothered her again since then.  Also has some knots on her abdomen wall that have been there but they are not been painful or tender a couple of them have been there for years but she had a few newer ones pop up on the right side in the last 6 months or so.  She says she really just wants to keep an eye on it but wanted me to be aware. She had some burning in her throat at that time .      ROS    Objective:     BP 129/76   Pulse 63   Ht 4\' 10"  (1.473 m)   Wt 111 lb (50.3 kg)   SpO2 95%   BMI 23.20 kg/m    Physical Exam Vitals and nursing note reviewed.  Constitutional:      Appearance: Normal appearance.  HENT:     Head: Normocephalic and atraumatic.  Eyes:     Conjunctiva/sclera: Conjunctivae normal.  Cardiovascular:     Rate and Rhythm: Normal rate and regular rhythm.  Pulmonary:     Effort: Pulmonary effort is normal.     Breath sounds: Normal breath sounds.  Skin:    General: Skin is warm and dry.  Neurological:     Mental Status: She is alert.  Psychiatric:        Mood and Affect: Mood normal.      No results found for any visits on 01/04/23.    The 10-year ASCVD risk score (Arnett DK, et al., 2019) is: 24.4%    Assessment & Plan:   Problem List Items  Addressed This Visit       Endocrine   Hypothyroidism    Due to recheck TSH.         Other   Hyperlipidemia    Has tried statin, Crestor, Livalo, pravastatin, atorvastatin.  Will try lovastatin.  Can start with half a tab every other day and see if tolerated.      Relevant Medications   lovastatin (MEVACOR) 10 MG tablet   Other Visit Diagnoses     Encounter for immunization    -  Primary   Relevant Orders   Flu Vaccine Trivalent High Dose (Fluad) (Completed)   Atypical chest pain       Relevant Orders   CBC with Differential/Platelet   CMP14+EGFR   TSH   EKG 12-Lead   Lipoma of torso          Atypical chest pain-will get an EKG for further workup today.  It sounds like it was most likely GERD related she had a little bit of burning in her throat at the same time and did take her PPI and symptoms did  improve.  Continue to work on reflux dietary measures.  EKG today shows rate of 56 bpm, sinus bradycardia with right bundle branch block.  No changes compared to prior EKG from 2018.  Couple of lesions on the abdominal wall are consistent with lipomas they have been there for a long time and they are soft but one of the areas is definitely much more firm along the left lower rib cage but she says that it has been there for years as well.  She says she would prefer to just keep an eye on it and let me know if she feels like it is changing becoming more painful or more sore  No follow-ups on file.    [name], MD

## 2023-01-04 NOTE — Assessment & Plan Note (Signed)
Due to recheck TSH. 

## 2023-01-04 NOTE — Assessment & Plan Note (Signed)
Has tried statin, Crestor, Livalo, pravastatin, atorvastatin.  Will try lovastatin.  Can start with half a tab every other day and see if tolerated.

## 2023-01-05 LAB — CBC WITH DIFFERENTIAL/PLATELET
Basophils Absolute: 0.1 10*3/uL (ref 0.0–0.2)
Basos: 2 %
EOS (ABSOLUTE): 0.3 10*3/uL (ref 0.0–0.4)
Eos: 5 %
Hematocrit: 41.2 % (ref 34.0–46.6)
Hemoglobin: 13.4 g/dL (ref 11.1–15.9)
Immature Grans (Abs): 0 10*3/uL (ref 0.0–0.1)
Immature Granulocytes: 0 %
Lymphocytes Absolute: 1.3 10*3/uL (ref 0.7–3.1)
Lymphs: 24 %
MCH: 30.6 pg (ref 26.6–33.0)
MCHC: 32.5 g/dL (ref 31.5–35.7)
MCV: 94 fL (ref 79–97)
Monocytes Absolute: 0.5 10*3/uL (ref 0.1–0.9)
Monocytes: 9 %
Neutrophils Absolute: 3.2 10*3/uL (ref 1.4–7.0)
Neutrophils: 60 %
Platelets: 306 10*3/uL (ref 150–450)
RBC: 4.38 x10E6/uL (ref 3.77–5.28)
RDW: 12.8 % (ref 11.7–15.4)
WBC: 5.3 10*3/uL (ref 3.4–10.8)

## 2023-01-05 LAB — CMP14+EGFR
ALT: 15 [IU]/L (ref 0–32)
AST: 23 [IU]/L (ref 0–40)
Albumin: 4.4 g/dL (ref 3.8–4.8)
Alkaline Phosphatase: 92 [IU]/L (ref 44–121)
BUN/Creatinine Ratio: 17 (ref 12–28)
BUN: 18 mg/dL (ref 8–27)
Bilirubin Total: 0.3 mg/dL (ref 0.0–1.2)
CO2: 22 mmol/L (ref 20–29)
Calcium: 9.7 mg/dL (ref 8.7–10.3)
Chloride: 100 mmol/L (ref 96–106)
Creatinine, Ser: 1.06 mg/dL — ABNORMAL HIGH (ref 0.57–1.00)
Globulin, Total: 2.8 g/dL (ref 1.5–4.5)
Glucose: 82 mg/dL (ref 70–99)
Potassium: 4.7 mmol/L (ref 3.5–5.2)
Sodium: 140 mmol/L (ref 134–144)
Total Protein: 7.2 g/dL (ref 6.0–8.5)
eGFR: 53 mL/min/{1.73_m2} — ABNORMAL LOW (ref >59)

## 2023-01-05 LAB — TSH: TSH: 2.76 u[IU]/mL (ref 0.450–4.500)

## 2023-01-05 NOTE — Progress Notes (Signed)
Call patient: Kidney function is stable in fact it looks like it is back to baseline which is great.  Liver function is normal.  Thyroid and blood count look great.

## 2023-01-25 ENCOUNTER — Encounter: Payer: Self-pay | Admitting: Family Medicine

## 2023-01-25 ENCOUNTER — Ambulatory Visit: Payer: Medicare HMO | Admitting: Family Medicine

## 2023-01-25 ENCOUNTER — Telehealth: Payer: Self-pay | Admitting: Family Medicine

## 2023-01-25 VITALS — BP 125/76 | HR 100 | Ht <= 58 in | Wt 115.8 lb

## 2023-01-25 DIAGNOSIS — U071 COVID-19: Secondary | ICD-10-CM | POA: Diagnosis not present

## 2023-01-25 DIAGNOSIS — J329 Chronic sinusitis, unspecified: Secondary | ICD-10-CM | POA: Insufficient documentation

## 2023-01-25 DIAGNOSIS — J029 Acute pharyngitis, unspecified: Secondary | ICD-10-CM | POA: Insufficient documentation

## 2023-01-25 DIAGNOSIS — R519 Headache, unspecified: Secondary | ICD-10-CM

## 2023-01-25 DIAGNOSIS — R110 Nausea: Secondary | ICD-10-CM

## 2023-01-25 HISTORY — DX: COVID-19: U07.1

## 2023-01-25 LAB — POC COVID19 BINAXNOW: SARS Coronavirus 2 Ag: POSITIVE — AB

## 2023-01-25 LAB — POCT INFLUENZA A/B
Influenza A, POC: NEGATIVE
Influenza B, POC: NEGATIVE

## 2023-01-25 MED ORDER — ONDANSETRON HCL 4 MG PO TABS: 4.0000 mg | ORAL_TABLET | Freq: Three times a day (TID) | ORAL | 0 refills | Status: DC | PRN

## 2023-01-25 MED ORDER — AMOXICILLIN-POT CLAVULANATE 875-125 MG PO TABS: 1.0000 | ORAL_TABLET | Freq: Two times a day (BID) | ORAL | 0 refills | Status: DC

## 2023-01-25 MED ORDER — PROMETHAZINE HCL 12.5 MG PO TABS: 12.5000 mg | ORAL_TABLET | Freq: Three times a day (TID) | ORAL | 0 refills | Status: DC | PRN

## 2023-01-25 NOTE — Assessment & Plan Note (Signed)
Pt doesn't really want to be tested for flu, strep, covid and in the absence of body aches I think that it is ok for now. We did discuss that she is within window to get treatment if she is positive however she says she feels like this is more like a sinus infection. Told her if she doesn't improve on antibiotics we may need to swab her.

## 2023-01-25 NOTE — Assessment & Plan Note (Signed)
-   likely secondary to covid19  - recommended tylenol or ibuprofen for pain

## 2023-01-25 NOTE — Telephone Encounter (Signed)
Patient's son called he is requesting a call back  [phone]

## 2023-01-25 NOTE — Assessment & Plan Note (Deleted)
Due to pt's history of head congestion and sinus tenderness to palpation on exam will go ahead and treat with augmentin

## 2023-01-25 NOTE — Progress Notes (Signed)
Established patient visit   Patient: Jessica Dawson   DOB: August 29, 1945   77 y.o. Female  MRN: [id number] Visit Date: 01/25/2023  Today's healthcare provider: Charlton Amor, DO   Chief Complaint  Patient presents with   Headache    Cough. Sore throat x2days    SUBJECTIVE    Chief Complaint  Patient presents with   Headache    Cough. Sore throat x2days   HPI HPI     Headache    Additional comments: Cough. Sore throat x2days      Last edited by Roselyn Reef, CMA on 01/25/2023  3:48 PM.       Pt presents for concerns of sore throat, headache, cough and sinus congestion for two days. Says she has been feeling fatigued as well. Admits to productive sputum.   Review of Systems  Constitutional:  Negative for activity change, fatigue and fever.  Respiratory:  Negative for cough and shortness of breath.   Cardiovascular:  Negative for chest pain.  Gastrointestinal:  Negative for abdominal pain.  Genitourinary:  Negative for difficulty urinating.       Current Meds  Medication Sig   clotrimazole (MYCELEX) 10 MG troche Take 1 tablet (10 mg total) by mouth 5 (five) times daily.   gabapentin (NEURONTIN) 100 MG capsule Take 2 capsules by mouth twice daily   levothyroxine (SYNTHROID) 88 MCG tablet Take 1 tablet (88 mcg total) by mouth daily before breakfast.   lovastatin (MEVACOR) 10 MG tablet Take 1 tablet (10 mg total) by mouth every other day.   omeprazole (PRILOSEC) 40 MG capsule Take 1 capsule by mouth once daily   promethazine (PHENERGAN) 12.5 MG tablet Take 1 tablet (12.5 mg total) by mouth every 8 (eight) hours as needed for nausea or vomiting.   [DISCONTINUED] amoxicillin-clavulanate (AUGMENTIN) 875-125 MG tablet Take 1 tablet by mouth 2 (two) times daily for 5 days.   [DISCONTINUED] ondansetron (ZOFRAN) 4 MG tablet Take 1 tablet (4 mg total) by mouth every 8 (eight) hours as needed for nausea or vomiting.    OBJECTIVE    BP 125/76 (BP Location: Left Arm,  Patient Position: Sitting, Cuff Size: Normal)   Pulse 100   Ht 4\' 10"  (1.473 m)   Wt 115 lb 12 oz (52.5 kg)   SpO2 100%   BMI 24.19 kg/m   Physical Exam Vitals and nursing note reviewed.  Constitutional:      General: She is not in acute distress.    Appearance: Normal appearance.  HENT:     Head: Normocephalic and atraumatic.     Right Ear: External ear normal.     Left Ear: External ear normal.     Nose: Congestion present.     Mouth/Throat:     Pharynx: Posterior oropharyngeal erythema present. No oropharyngeal exudate.  Eyes:     Conjunctiva/sclera: Conjunctivae normal.  Cardiovascular:     Rate and Rhythm: Normal rate and regular rhythm.  Pulmonary:     Effort: Pulmonary effort is normal.     Breath sounds: Normal breath sounds.  Neurological:     General: No focal deficit present.     Mental Status: She is alert and oriented to person, place, and time.  Psychiatric:        Mood and Affect: Mood normal.        Behavior: Behavior normal.        Thought Content: Thought content normal.        Judgment: Judgment  normal.        ASSESSMENT & PLAN    Problem List Items Addressed This Visit       Other   Sore throat    Pt doesn't really want to be tested for flu, strep, covid and in the absence of body aches I think that it is ok for now. We did discuss that she is within window to get treatment if she is positive however she says she feels like this is more like a sinus infection. Told her if she doesn't improve on antibiotics we may need to swab her.       Relevant Orders   POCT Influenza A/B (Completed)   POC COVID-19 (Completed)   COVID-19 - Primary    Covid 19 test positive  - we discussed paxlovid with patient and son and son said not to do paxlovid  - recommended supportive care in the meantime with vit c, d, and zinc as well as rest       Nonintractable headache    - likely secondary to covid19  - recommended tylenol or ibuprofen for pain         Relevant Orders   POCT Influenza A/B (Completed)   POC COVID-19 (Completed)   Nausea    Have gone ahead and sent in phenergan for nausea per patient request. At first zofran was sent in but EKG shows qtc of 470 so we will do phenergan just to be safe      Relevant Orders   POCT Influenza A/B (Completed)   POC COVID-19 (Completed)    No follow-ups on file.      Meds ordered this encounter  Medications   DISCONTD: amoxicillin-clavulanate (AUGMENTIN) 875-125 MG tablet    Sig: Take 1 tablet by mouth 2 (two) times daily for 5 days.    Dispense:  10 tablet    Refill:  0   DISCONTD: ondansetron (ZOFRAN) 4 MG tablet    Sig: Take 1 tablet (4 mg total) by mouth every 8 (eight) hours as needed for nausea or vomiting.    Dispense:  20 tablet    Refill:  0   promethazine (PHENERGAN) 12.5 MG tablet    Sig: Take 1 tablet (12.5 mg total) by mouth every 8 (eight) hours as needed for nausea or vomiting.    Dispense:  20 tablet    Refill:  0    Orders Placed This Encounter  Procedures   POCT Influenza A/B   POC COVID-19    Order Specific Question:   Previously tested for COVID-19    Answer:   No    Order Specific Question:   Resident in a congregate (group) care setting    Answer:   No    Order Specific Question:   Employed in healthcare setting    Answer:   No    Order Specific Question:   Pregnant    Answer:   No     Charlton Amor, DO  [facility] at [facility] [phone] (phone) [phone] (fax)  [facility]

## 2023-01-25 NOTE — Assessment & Plan Note (Signed)
Covid 19 test positive  - we discussed paxlovid with patient and son and son said not to do paxlovid  - recommended supportive care in the meantime with vit c, d, and zinc as well as rest

## 2023-01-25 NOTE — Assessment & Plan Note (Addendum)
Have gone ahead and sent in phenergan for nausea per patient request. At first zofran was sent in but EKG shows qtc of 470 so we will do phenergan just to be safe

## 2023-01-26 NOTE — Telephone Encounter (Signed)
Called pt's son and lvm for rtn call

## 2023-02-06 ENCOUNTER — Telehealth: Payer: Self-pay | Admitting: Family Medicine

## 2023-02-06 ENCOUNTER — Telehealth: Payer: Self-pay

## 2023-02-06 NOTE — Telephone Encounter (Signed)
Patient is still testing positive for Covid  she would like a call back regarding this matter.

## 2023-02-06 NOTE — Telephone Encounter (Signed)
FYI  I advised patient is not uncommon to test positive weeks after having Covid.

## 2023-02-06 NOTE — Telephone Encounter (Signed)
Copied from CRM [id number]. Topic: Clinical - Medical Advice >> Feb 06, 2023  2:36 PM Amy B wrote: Reason for CRM: Patient states she is still testing positive for COVD.  She called this morning but no one has called her back.  She wants to know if she should come into the clinic.  Please call her [phone].

## 2023-03-22 ENCOUNTER — Other Ambulatory Visit: Payer: Self-pay | Admitting: Family Medicine

## 2023-03-22 DIAGNOSIS — M5416 Radiculopathy, lumbar region: Secondary | ICD-10-CM

## 2023-03-22 DIAGNOSIS — Z9889 Other specified postprocedural states: Secondary | ICD-10-CM

## 2023-04-13 DIAGNOSIS — M79645 Pain in left finger(s): Secondary | ICD-10-CM | POA: Diagnosis not present

## 2023-04-13 DIAGNOSIS — S61217A Laceration without foreign body of left little finger without damage to nail, initial encounter: Secondary | ICD-10-CM | POA: Diagnosis not present

## 2023-07-05 ENCOUNTER — Encounter: Payer: Self-pay | Admitting: Family Medicine

## 2023-07-05 ENCOUNTER — Ambulatory Visit (INDEPENDENT_AMBULATORY_CARE_PROVIDER_SITE_OTHER): Payer: Medicare HMO | Admitting: Family Medicine

## 2023-07-05 VITALS — BP 120/62 | HR 66 | Ht <= 58 in | Wt 114.0 lb

## 2023-07-05 DIAGNOSIS — E039 Hypothyroidism, unspecified: Secondary | ICD-10-CM

## 2023-07-05 DIAGNOSIS — M810 Age-related osteoporosis without current pathological fracture: Secondary | ICD-10-CM | POA: Diagnosis not present

## 2023-07-05 DIAGNOSIS — I959 Hypotension, unspecified: Secondary | ICD-10-CM | POA: Diagnosis not present

## 2023-07-05 DIAGNOSIS — E7800 Pure hypercholesterolemia, unspecified: Secondary | ICD-10-CM | POA: Diagnosis not present

## 2023-07-05 DIAGNOSIS — K219 Gastro-esophageal reflux disease without esophagitis: Secondary | ICD-10-CM | POA: Diagnosis not present

## 2023-07-05 NOTE — Assessment & Plan Note (Signed)
 Pressures have been dipping low since July 2024.  She did bring in her log with her today.  Most of the blood pressures are around the 100-110.  But some of them are in the 80s and 90s systolic.  The lowest blood pressure recorded was 73/46.  Pulse pretty consistently stays in the 60s to 70s.  She says when the blood pressure goes low she actually feels tired and sleepy and weak.  Will plan to recheck TSH today to make sure that levels are adequate we discussed making sure to push fluids and to liberalize salt.  If that does not help keep blood pressures better regulated then we can opt for medication and we did discuss that today.

## 2023-07-05 NOTE — Progress Notes (Signed)
 Established Patient Office Visit  Subjective  Patient ID: Jessica Dawson, female    DOB: 1945-11-11  Age: 78 y.o. MRN: [id number]  Chief Complaint  Patient presents with   Hypothyroidism   Hyperlipidemia   Osteoporosis    HPI  Hyperlipidemia-even though she has had multiple intolerances to statin she was willing to try Mevacor  last fall.  Had Recommended her starting with half a tab every other day.  Her daughter is in need of a liver transplant, and wants to know if she could be a candidate to help donate part of her liver.  Her daughter has cirrhosis and has been in and out of the hospital.  She has been tracking her blood pressures because she has been having spells where it is low and she feels weak and tired and most sleepy.  In the last 2 weeks she has tried more diligently to keep her fluid intake up. .   ROS    Objective:     BP 120/62 (BP Location: Left Arm, Cuff Size: Normal)   Pulse 66   Ht 4\' 10"  (1.473 m)   Wt 114 lb (51.7 kg)   SpO2 100%   BMI 23.83 kg/m    Physical Exam Vitals and nursing note reviewed.  Constitutional:      Appearance: Normal appearance.  HENT:     Head: Normocephalic and atraumatic.  Eyes:     Conjunctiva/sclera: Conjunctivae normal.  Cardiovascular:     Rate and Rhythm: Normal rate and regular rhythm.  Pulmonary:     Effort: Pulmonary effort is normal.     Breath sounds: Normal breath sounds.  Skin:    General: Skin is warm and dry.  Neurological:     Mental Status: She is alert.  Psychiatric:        Mood and Affect: Mood normal.    No results found for any visits on 07/05/23.    The ASCVD Risk score (Arnett DK, et al., 2019) failed to calculate for the following reasons:   The 2019 ASCVD risk score is only valid for ages 5 to 69    Assessment & Plan:   Problem List Items Addressed This Visit       Cardiovascular and Mediastinum   Hypotension   Pressures have been dipping low since July 2024.  She did bring  in her log with her today.  Most of the blood pressures are around the 100-110.  But some of them are in the 80s and 90s systolic.  The lowest blood pressure recorded was 73/46.  Pulse pretty consistently stays in the 60s to 70s.  She says when the blood pressure goes low she actually feels tired and sleepy and weak.  Will plan to recheck TSH today to make sure that levels are adequate we discussed making sure to push fluids and to liberalize salt.  If that does not help keep blood pressures better regulated then we can opt for medication and we did discuss that today.        Digestive   GERD   Has been having more reflux lately.  Try taking the omepazole every day for a few week to get symptoms under better control         Endocrine   Hypothyroidism   Taking meds regularly.   Lab Results  Component Value Date   TSH 2.760 01/04/2023         Relevant Orders   DG Bone Density   CMP14+EGFR  Lipid panel   CBC   TSH   VITAMIN D  25 Hydroxy (Vit-D Deficiency, Fractures)     Musculoskeletal and Integument   Osteoporosis   Due for repeat DEXA as T-score of -3.4.  We had discussed last time putting her on treatment but she declined.      Relevant Orders   DG Bone Density   CMP14+EGFR   Lipid panel   CBC   TSH   VITAMIN D  25 Hydroxy (Vit-D Deficiency, Fractures)     Other   Hyperlipidemia - Primary   Only on a statin and tolerating well thus far.  Due to recheck lipid panel and liver function.      Relevant Orders   DG Bone Density   CMP14+EGFR   Lipid panel   CBC   TSH   VITAMIN D  25 Hydroxy (Vit-D Deficiency, Fractures)   Regards to being a candidate for liver transplant she would need to speak with her daughters transplant team but I would suspect based on age alone she would probably not qualify even though she is actually extremely healthy.  Return in about 6 months (around 01/04/2024).    Duaine German, MD

## 2023-07-05 NOTE — Assessment & Plan Note (Signed)
 Only on a statin and tolerating well thus far.  Due to recheck lipid panel and liver function.

## 2023-07-05 NOTE — Assessment & Plan Note (Signed)
 Has been having more reflux lately.  Try taking the omepazole every day for a few week to get symptoms under better control

## 2023-07-05 NOTE — Patient Instructions (Signed)
 Try taking the omepazole every day for a few week to get symptoms under better control

## 2023-07-05 NOTE — Assessment & Plan Note (Signed)
 Taking meds regularly.   Lab Results  Component Value Date   TSH 2.760 01/04/2023

## 2023-07-05 NOTE — Assessment & Plan Note (Signed)
 Due for repeat DEXA as T-score of -3.4.  We had discussed last time putting her on treatment but she declined.

## 2023-07-06 ENCOUNTER — Ambulatory Visit

## 2023-07-06 DIAGNOSIS — M810 Age-related osteoporosis without current pathological fracture: Secondary | ICD-10-CM

## 2023-07-06 DIAGNOSIS — E7800 Pure hypercholesterolemia, unspecified: Secondary | ICD-10-CM

## 2023-07-06 DIAGNOSIS — Z13820 Encounter for screening for osteoporosis: Secondary | ICD-10-CM | POA: Diagnosis not present

## 2023-07-06 DIAGNOSIS — E039 Hypothyroidism, unspecified: Secondary | ICD-10-CM

## 2023-07-06 LAB — LIPID PANEL
Chol/HDL Ratio: 5.1 ratio — ABNORMAL HIGH (ref 0.0–4.4)
Cholesterol, Total: 302 mg/dL — ABNORMAL HIGH (ref 100–199)
HDL: 59 mg/dL (ref >39)
LDL Chol Calc (NIH): 216 mg/dL — ABNORMAL HIGH (ref 0–99)
Triglycerides: 144 mg/dL (ref 0–149)
VLDL Cholesterol Cal: 27 mg/dL (ref 5–40)

## 2023-07-06 LAB — CMP14+EGFR
ALT: 17 IU/L (ref 0–32)
AST: 30 IU/L (ref 0–40)
Albumin: 4.7 g/dL (ref 3.8–4.8)
Alkaline Phosphatase: 97 IU/L (ref 44–121)
BUN/Creatinine Ratio: 17 (ref 12–28)
BUN: 18 mg/dL (ref 8–27)
Bilirubin Total: 0.4 mg/dL (ref 0.0–1.2)
CO2: 20 mmol/L (ref 20–29)
Calcium: 9.8 mg/dL (ref 8.7–10.3)
Chloride: 103 mmol/L (ref 96–106)
Creatinine, Ser: 1.06 mg/dL — ABNORMAL HIGH (ref 0.57–1.00)
Globulin, Total: 2.3 g/dL (ref 1.5–4.5)
Glucose: 80 mg/dL (ref 70–99)
Potassium: 5.1 mmol/L (ref 3.5–5.2)
Sodium: 141 mmol/L (ref 134–144)
Total Protein: 7 g/dL (ref 6.0–8.5)
eGFR: 53 mL/min/{1.73_m2} — ABNORMAL LOW (ref >59)

## 2023-07-06 LAB — CBC
Hematocrit: 39.7 % (ref 34.0–46.6)
Hemoglobin: 13.4 g/dL (ref 11.1–15.9)
MCH: 30.4 pg (ref 26.6–33.0)
MCHC: 33.8 g/dL (ref 31.5–35.7)
MCV: 90 fL (ref 79–97)
Platelets: 326 10*3/uL (ref 150–450)
RBC: 4.41 x10E6/uL (ref 3.77–5.28)
RDW: 12.2 % (ref 11.7–15.4)
WBC: 6.1 10*3/uL (ref 3.4–10.8)

## 2023-07-06 LAB — VITAMIN D 25 HYDROXY (VIT D DEFICIENCY, FRACTURES): Vit D, 25-Hydroxy: 24.9 ng/mL — ABNORMAL LOW (ref 30.0–100.0)

## 2023-07-06 LAB — TSH: TSH: 1.72 u[IU]/mL (ref 0.450–4.500)

## 2023-07-06 NOTE — Progress Notes (Signed)
 Call pt: Your kidney function is stable. Cholesterol is still really high. Keep taking the med even if just twice a week. Your vitamin D  is low. Make sure taking 25mcg daily. You can pick this up over the counter. Your blood count and thyroid  is normal.

## 2023-07-06 NOTE — Progress Notes (Signed)
 Call patient: Bone density shows T-score of -3.0 consistent with osteoporosis.  No significant change from prior test which is good so it is pretty stable.   The current recommendation for osteoporosis treatment includes:   #1 calcium -total of 1200 mg of calcium  daily.  If you eat a very calcium  rich diet you may be able to obtain that without a supplement.  If not, then I recommend calcium  500 mg twice a day.  There are several products over-the-counter such as Caltrate D and Viactiv chews which are great options that contain calcium  and vitamin D . #2 vitamin D -recommend 800 international units daily. #3 exercise-recommend 30 minutes of weightbearing exercise 3 days a week.  Resistance training ,such as doing bands and light weights, can be particularly helpful. #4 medication-if you are not currently on a bone builder, also called a bisphosphonate, then this has been shown to be very helpful in maintaining bone strength, preventing further thinning of the bones, and reducing your risk for fractures.  I would highly recommend that you consider starting 1 of these medications.  If you are okay with that then please let us  know and we will send one to your pharmacy.  If you would like to discuss further we are happy to make an appointment for you so that we can go over options for treatment.

## 2023-08-10 ENCOUNTER — Other Ambulatory Visit: Payer: Self-pay | Admitting: Family Medicine

## 2023-08-10 DIAGNOSIS — E039 Hypothyroidism, unspecified: Secondary | ICD-10-CM

## 2023-09-27 ENCOUNTER — Other Ambulatory Visit: Payer: Self-pay | Admitting: Family Medicine

## 2023-09-27 DIAGNOSIS — M5416 Radiculopathy, lumbar region: Secondary | ICD-10-CM

## 2023-09-27 DIAGNOSIS — Z9889 Other specified postprocedural states: Secondary | ICD-10-CM

## 2023-10-31 ENCOUNTER — Other Ambulatory Visit: Payer: Self-pay | Admitting: Family Medicine

## 2023-10-31 DIAGNOSIS — E039 Hypothyroidism, unspecified: Secondary | ICD-10-CM

## 2023-12-13 ENCOUNTER — Ambulatory Visit: Payer: Self-pay

## 2023-12-13 NOTE — Telephone Encounter (Signed)
 FYI Only or Action Required?: Action required by provider: request for appointment.  Patient was last seen in primary care on 07/05/2023 by Alvan Dorothyann BIRCH, MD.  Called Nurse Triage reporting Back Pain.  Symptoms began several weeks ago.  Interventions attempted: Prescription medications: Muscle relaxers, Rest, hydration, or home remedies, and Ice/heat application.  Symptoms are: gradually worsening.  Triage Disposition: See PCP When Office is Open (Within 3 Days)  Patient/caregiver understands and will follow disposition?: Unsure    Copied from CRM [id number]. Topic: Clinical - Red Word Triage >> Dec 13, 2023 12:44 PM DeAngela L wrote: Red Word that prompted transfer to Nurse Triage: patient has right side back pain and also the front side she is concerned and not sure if its kidney stones on the right side this has been hurting for almost two weeks and the pain has not stopped but the patient also not sure if doing yard work caused this pain   No lefts side pain where she had back surgery   Pt num [phone] (M) Reason for Disposition  [1] MODERATE back pain (e.g., interferes with normal activities) AND [2] present > 3 days  Answer Assessment - Initial Assessment Questions This RN attempted to schedule patient in office Monday or Tuesday. Patient refused appointments in office and states she is unable to make it in office for an appointment due to transportation issues. Patient also offered a virtual appointment and declined. Patient would like a call back regarding setting up an appointment when she has transportation.    1. ONSET: When did the pain begin? (e.g., minutes, hours, days)     2 weeks  2. LOCATION: Where does it hurt? (upper, mid or lower back)     R side lower back  3. SEVERITY: How bad is the pain?  (e.g., Scale 1-10; mild, moderate, or severe)     5/10 4. PATTERN: Is the pain constant? (e.g., yes, no; constant, intermittent)      Can be constant  and is also intermittent  5. RADIATION: Does the pain shoot into your legs or somewhere else?     Shoots down to groin area into the top part of her leg  6. CAUSE:  What do you think is causing the back pain?      Unsure 7. BACK OVERUSE:  Any recent lifting of heavy objects, strenuous work or exercise?     Did some recent yard work and has been pulling legs to stretch them  8. MEDICINES: What have you taken so far for the pain? (e.g., nothing, acetaminophen, NSAIDS)     Muscle relaxer medication  9. NEUROLOGIC SYMPTOMS: Do you have any weakness, numbness, or problems with bowel/bladder control?     Denies  10. OTHER SYMPTOMS: Do you have any other symptoms? (e.g., fever, abdomen pain, burning with urination, blood in urine)       R lower side in the front of her body, one episode of diarrhea.  Protocols used: Back Pain-A-AH

## 2023-12-13 NOTE — Telephone Encounter (Signed)
 FYI - The RN attempted to schedule patient in office Monday or Tuesday. Patient refused appointments in office and states she is unable to make it in office for an appointment due to transportation issues. Patient also offered a virtual appointment and declined. The patient has an upcoming appointment scheduled on 01/03/24.

## 2023-12-14 ENCOUNTER — Ambulatory Visit (HOSPITAL_BASED_OUTPATIENT_CLINIC_OR_DEPARTMENT_OTHER)
Admission: RE | Admit: 2023-12-14 | Discharge: 2023-12-14 | Disposition: A | Discharge status: Home / Self Care | Source: Ambulatory Visit | Attending: Medical-Surgical | Admitting: Medical-Surgical

## 2023-12-14 ENCOUNTER — Encounter: Payer: Self-pay | Admitting: Medical-Surgical

## 2023-12-14 ENCOUNTER — Telehealth: Payer: Self-pay

## 2023-12-14 ENCOUNTER — Ambulatory Visit (INDEPENDENT_AMBULATORY_CARE_PROVIDER_SITE_OTHER): Admitting: Medical-Surgical

## 2023-12-14 VITALS — BP 160/92 | HR 81 | Resp 20 | Ht <= 58 in | Wt 118.0 lb

## 2023-12-14 DIAGNOSIS — K5730 Diverticulosis of large intestine without perforation or abscess without bleeding: Secondary | ICD-10-CM | POA: Diagnosis not present

## 2023-12-14 DIAGNOSIS — R1031 Right lower quadrant pain: Secondary | ICD-10-CM | POA: Diagnosis not present

## 2023-12-14 DIAGNOSIS — M545 Low back pain, unspecified: Secondary | ICD-10-CM | POA: Diagnosis not present

## 2023-12-14 MED ORDER — METHOCARBAMOL 500 MG PO TABS: 500.0000 mg | ORAL_TABLET | Freq: Three times a day (TID) | ORAL | 0 refills | Status: DC | PRN

## 2023-12-14 NOTE — Progress Notes (Signed)
        Established patient visit  History of Present Illness   Discussed the use of AI scribe software for clinical note transcription with the patient, who gave verbal consent to proceed.  History of Present Illness   Jessica Dawson is an 78 year old female who presents with back pain and a bulging area following yard work.  Acute back and RLQ pain with RLQ swelling - Onset two weeks ago after lifting and rolling a large rock during yard work - Severe pain began two days after the activity, initially rated 14/10, now decreased to 5/10 in the abdomen and 2/10 in the right lower back - Swelling described as a bulge present since yesterday in the RLQ - Pain worsens with certain movements, such as looking down at her toes - Tenderness to palpation in the RLQ and right inguinal crease  Symptom management and functional impact - Pain managed with hot pad, methocarbamol, and Tylenol - Gradual improvement in symptoms  Physical Exam   Physical Exam Vitals reviewed.  Constitutional:      General: She is not in acute distress.    Appearance: Normal appearance. She is well-developed. She is not ill-appearing.  HENT:     Head: Normocephalic and atraumatic.  Cardiovascular:     Rate and Rhythm: Normal rate and regular rhythm.     Pulses: Normal pulses.     Heart sounds: Normal heart sounds. No murmur heard.    No friction rub. No gallop.  Pulmonary:     Effort: Pulmonary effort is normal. No respiratory distress.     Breath sounds: Normal breath sounds. No wheezing.  Abdominal:     General: Bowel sounds are normal.     Palpations: There is no hepatomegaly or mass.     Tenderness: There is abdominal tenderness in the right lower quadrant. There is no guarding or rebound.     Hernia: No hernia (none palpable) is present.  Skin:    General: Skin is warm and dry.  Neurological:     Mental Status: She is alert and oriented to person, place, and time.  Psychiatric:        Mood and  Affect: Mood normal.        Behavior: Behavior normal.        Thought Content: Thought content normal.        Judgment: Judgment normal.    Assessment & Plan   Assessment and Plan    Right lower abdominal wall bulge, possible hernia Right lower abdominal wall bulge suggests possible hernia. - Order CT scan of the abdomen to evaluate bulge and rule out hernia. - Ok to use Tylenol as needed.  Low back pain Low back pain likely due to muscular strain from physical exertion. Gradually improving. - Continue Tylenol and methocarbamol as needed for pain relief.    Follow up   Return if symptoms worsen or fail to improve. __________________________________ Jessica FREDRIK Palin, DNP, APRN, FNP-BC Primary Care and Sports Medicine [facility]

## 2023-12-14 NOTE — Telephone Encounter (Signed)
 Zada Palin, NP asked me to reach out to you.  She has ordered a CT of the abdomen pelvis without contrast  This is NOT a STAT order but the patient has transportation issues and has a ride today.  She was wondering two things- Whether a PA Is required for [facility]  And whether it would be possible to schedule her today for this to be done at [facility]?

## 2023-12-17 ENCOUNTER — Telehealth: Payer: Self-pay

## 2023-12-17 NOTE — Telephone Encounter (Signed)
 Noted    Copied from CRM #[id number]. Topic: Clinical - Lab/Test Results >> Dec 17, 2023  2:09 PM Jessica Dawson wrote: Reason for CRM: Patient calling for results of Friday 10/3. Advised patient test not resulted. Please contact patient when results are available. Patient does not have my chart.

## 2023-12-18 NOTE — Telephone Encounter (Signed)
 Provider is out of the office today , will send Provider message for CT results for patient.  Copied from CRM [id number]. Topic: General - Other >> Dec 18, 2023 11:48 AM Olam RAMAN wrote: Reason for CRM:  Calling to get CT results CB [phone]

## 2023-12-19 ENCOUNTER — Telehealth: Payer: Self-pay

## 2023-12-19 ENCOUNTER — Ambulatory Visit: Payer: Self-pay | Admitting: Medical-Surgical

## 2023-12-19 NOTE — Telephone Encounter (Signed)
 Copied from CRM #[id number]. Topic: Clinical - Lab/Test Results >> Dec 19, 2023 12:59 PM Turkey B wrote: Reason for CRM: patient called in for ct scan results of abdomen. Please cb when notes are in

## 2023-12-19 NOTE — Telephone Encounter (Signed)
 Patient called - requesting results of CT  Pateint was informed that the CT was received but not yet reviewed by provider.

## 2023-12-19 NOTE — Telephone Encounter (Signed)
 I tried to call patient. The line busy x 3.

## 2023-12-21 ENCOUNTER — Telehealth: Payer: Self-pay | Admitting: Pharmacy Technician

## 2023-12-21 ENCOUNTER — Other Ambulatory Visit (HOSPITAL_COMMUNITY): Payer: Self-pay

## 2023-12-21 MED ORDER — TIZANIDINE HCL 4 MG PO TABS: 2.0000 mg | ORAL_TABLET | Freq: Three times a day (TID) | ORAL | 1 refills | Status: DC | PRN

## 2023-12-21 NOTE — Telephone Encounter (Signed)
 Pharmacy Patient Advocate Encounter   Received notification from Onbase that prior authorization for Methocarbamol 500 mg tablet is required/requested.   Insurance verification completed.   The patient is insured through [employer].   Per test claim: Per test claim, medication is not covered due to plan/benefit exclusion, PA not submitted at this time   Tizanidine tablets are preferred, ran test claim for tizanidine 4mg  and her copay will be $1.69

## 2023-12-21 NOTE — Telephone Encounter (Signed)
 Patient to let her know that insurance will not cover methocarbamol however it does cover tizanidine.  New prescription sent in for tizanidine 2 mg (1/2 tablet) up to 3 times daily as needed for back pain/muscle spasms.  ___________________________________________ Jessica FREDRIK Palin, DNP, APRN, FNP-BC Primary Care and Sports Medicine [facility]

## 2023-12-21 NOTE — Addendum Note (Signed)
 Addended byBETHA WILLO MINI on: 12/21/2023 01:16 PM   Modules accepted: Orders

## 2023-12-21 NOTE — Telephone Encounter (Signed)
 Attempted call to patient. Left a voice mail message requesting a return call.

## 2023-12-24 NOTE — Telephone Encounter (Signed)
 The patient called in returning a call to [facility]. I spoke with Tonya and she transferred the call to [facility]. I then transferred the patient to her for further assistance.

## 2023-12-24 NOTE — Telephone Encounter (Signed)
 Me    12/24/23  8:53 AM Note Spoke with patient informed of medication change to Tizanidine. She states she had already pain $16 for the Methocarbamol and if o.k. she will continue to take this until she runs out and then switch to the Tizanidine. ?   She also was wanting to know if she should still be having side and back pain that comes and goes  with constipation? States she ran out of miralax and has switched to Dulcolax yesterday. Her stools are soft  but not runny.  Wanted to know how long to continue with  stool softener or should she go back to Miralax.

## 2023-12-24 NOTE — Telephone Encounter (Signed)
 Patient informed. If symptoms start to worsen or are not improving she will schedule to be seen - currently has appt with Dr. Alvan for 01/03/2024 and will address with provider at this appointment. She will start back on Miralax daily and will stop the Dulcolax.  She will use her current Mehtocarbamol before starting on Tizanidine at next refill

## 2023-12-24 NOTE — Telephone Encounter (Signed)
 Attempted call to patient. Left a voice mail message requesting a return call.

## 2023-12-24 NOTE — Telephone Encounter (Signed)
 Pt called in requesting Kim, connected call over to [facility]

## 2023-12-27 ENCOUNTER — Other Ambulatory Visit: Payer: Self-pay | Admitting: Family Medicine

## 2023-12-27 DIAGNOSIS — M5416 Radiculopathy, lumbar region: Secondary | ICD-10-CM

## 2023-12-27 DIAGNOSIS — Z9889 Other specified postprocedural states: Secondary | ICD-10-CM

## 2024-01-02 ENCOUNTER — Encounter: Payer: Self-pay | Admitting: Family Medicine

## 2024-01-02 NOTE — Progress Notes (Signed)
 "  Established Patient Office Visit  Patient ID: Jessica Dawson, female    DOB: Dec 16, 1945  Age: 78 y.o. MRN: [id number] PCP: Alvan Dorothyann BIRCH, MD  Chief Complaint  Patient presents with   Hyperlipidemia   Hypertension   Chronic Kidney Disease    Subjective:     HPI  Discussed the use of AI scribe software for clinical note transcription with the patient, who gave verbal consent to proceed.  History of Present Illness Jessica Dawson is an 78 year old female with hypertension and chronic constipation who presents for blood pressure management and constipation concerns.  Blood pressure fluctuations - Home blood pressure readings generally within normal range: 124/80, 127/82, 118/76, 104/70 mmHg - Occasional low blood pressure readings, such as 97/54 mmHg, typically asymptomatic unless systolic drops into 80s or 70s - Episodes of hypotension in the 80s or 70s associated with weakness and fatigue - Notable episode of hypotension at 79/42 mmHg attributed to tizanidine  use, which has since been discontinued - Manages hypotensive episodes by increasing fluid and salt intake  Chronic constipation - Chronic constipation managed with regular Miralax use - Recent missed doses of Miralax due to social commitments - Bloating and abdominal discomfort associated with constipation - Management includes Miralax, increased water intake, and prunes  Thyroid  dysfunction - Takes 88 mcg of thyroid  medication daily - Associates hair loss with thyroid  condition - Uncertain of last thyroid  level check and open to re-evaluation  Musculoskeletal pain - Back pain related to physical activity, such as leaf blowing - Pain shifts sides, consistent with muscle strain - Uses heating pad and topical treatments for symptom relief  Immunization status - Received one shingles vaccine but has not completed the series - Received influenza vaccine for the current year - Considering Prevnar 20  vaccination     ROS    Objective:     BP 128/64   Pulse 71   Wt 112 lb (50.8 kg)   SpO2 100%   BMI 23.41 kg/m    Physical Exam Vitals and nursing note reviewed.  Constitutional:      Appearance: Normal appearance.  HENT:     Head: Normocephalic and atraumatic.  Eyes:     Conjunctiva/sclera: Conjunctivae normal.  Cardiovascular:     Rate and Rhythm: Normal rate and regular rhythm.  Pulmonary:     Effort: Pulmonary effort is normal.     Breath sounds: Normal breath sounds.  Abdominal:     Palpations: Abdomen is soft.     Comments: Mild distention, increased tympany  Skin:    General: Skin is warm and dry.  Neurological:     Mental Status: She is alert.  Psychiatric:        Mood and Affect: Mood normal.      Results for orders placed or performed in visit on 01/03/24  CMP14+EGFR  Result Value Ref Range   Glucose 92 70 - 99 mg/dL   BUN 17 8 - 27 mg/dL   Creatinine, Ser 9.01 0.57 - 1.00 mg/dL   eGFR 58 (L) >40 fO/fpw/8.26   BUN/Creatinine Ratio 17 12 - 28   Sodium 140 134 - 144 mmol/L   Potassium 4.4 3.5 - 5.2 mmol/L   Chloride 103 96 - 106 mmol/L   CO2 20 20 - 29 mmol/L   Calcium  10.2 8.7 - 10.3 mg/dL   Total Protein 7.1 6.0 - 8.5 g/dL   Albumin 4.4 3.8 - 4.8 g/dL   Globulin, Total 2.7 1.5 - 4.5  g/dL   Bilirubin Total 0.4 0.0 - 1.2 mg/dL   Alkaline Phosphatase 87 49 - 135 IU/L   AST 21 0 - 40 IU/L   ALT 14 0 - 32 IU/L  PTH, Intact and Calcium   Result Value Ref Range   PTH WILL FOLLOW    PTH Interp Comment   Urine Microalbumin w/creat. ratio  Result Value Ref Range   Creatinine, Urine 56.1 Not Estab. mg/dL   Microalbumin, Urine 78.6 Not Estab. ug/mL   Microalb/Creat Ratio 38 (H) 0 - 29 mg/g creat  VITAMIN D  25 Hydroxy (Vit-D Deficiency, Fractures)  Result Value Ref Range   Vit D, 25-Hydroxy 30.2 30.0 - 100.0 ng/mL  TSH  Result Value Ref Range   TSH 0.345 (L) 0.450 - 4.500 uIU/mL      The ASCVD Risk score (Arnett DK, et al., 2019) failed  to calculate for the following reasons:   The 2019 ASCVD risk score is only valid for ages 40 to 80    Assessment & Plan:   Problem List Items Addressed This Visit       Digestive   Chronic constipation     Endocrine   Hypothyroidism   Relevant Orders   CMP14+EGFR (Completed)   PTH, Intact and Calcium  (Completed)   Urine Microalbumin w/creat. ratio (Completed)   VITAMIN D  25 Hydroxy (Vit-D Deficiency, Fractures) (Completed)   TSH (Completed)     Genitourinary   CKD stage 3a, GFR 45-59 ml/min (HCC) - Primary   Relevant Orders   CMP14+EGFR (Completed)   PTH, Intact and Calcium  (Completed)   Urine Microalbumin w/creat. ratio (Completed)   VITAMIN D  25 Hydroxy (Vit-D Deficiency, Fractures) (Completed)   TSH (Completed)   Other Visit Diagnoses       Vitamin D  deficiency       Relevant Orders   CMP14+EGFR (Completed)   PTH, Intact and Calcium  (Completed)   Urine Microalbumin w/creat. ratio (Completed)   VITAMIN D  25 Hydroxy (Vit-D Deficiency, Fractures) (Completed)   TSH (Completed)     Encounter for immunization       Relevant Orders   Flu vaccine HIGH DOSE PF(Fluzone Trivalent) (Completed)     Acute right-sided low back pain without sciatica       Relevant Medications   lidocaine  (LIDODERM ) 5 %       Assessment and Plan Assessment & Plan Chronic constipation Chronic constipation with bloating, likely due to decreased colonic motility. CT scan showed stool burden without acute abnormalities. - Continue Miralax daily, adjust dose based on stool consistency. - Encourage increased fluid intake and dietary fiber. - Consider prunes or prune juice for additional fiber.  Chronic kidney disease, stage 3a Chronic kidney disease stage 3a. - Order urine microalbumin test.  Episodic hypotension Episodic hypotension with occasional weakness and dizziness, possibly exacerbated by tizanidine . Risk of falls due to low blood pressure. Discussed potential medication use if  episodes become more frequent or severe. - Monitor blood pressure regularly. - Encourage fluid and salt intake during low blood pressure episodes. - Advise rest and caution during episodes. - Consider medication to increase blood pressure if episodes become more frequent or severe.  Hypothyroidism Hypothyroidism with reported hair loss. Current dose is levothyroxine  88 mcg. - Recheck thyroid  function tests today. - Monitor symptoms and adjust levothyroxine  dosage if necessary.  General Health Maintenance Due for second dose of Shingrix vaccine. Needs Prevnar 20 vaccine as she had Prevnar 13 in 2016. - Recommend receiving second dose of Shingrix vaccine at pharmacy. -  Administer Prevnar 20 vaccine today if desired.    Return in about 6 months (around 07/03/2024).    Dorothyann Byars, MD [facility] Primary Care & Sports Medicine at [facility]   "

## 2024-01-03 ENCOUNTER — Ambulatory Visit (INDEPENDENT_AMBULATORY_CARE_PROVIDER_SITE_OTHER): Admitting: Family Medicine

## 2024-01-03 VITALS — BP 128/64 | HR 71 | Wt 112.0 lb

## 2024-01-03 DIAGNOSIS — Z23 Encounter for immunization: Secondary | ICD-10-CM | POA: Diagnosis not present

## 2024-01-03 DIAGNOSIS — E559 Vitamin D deficiency, unspecified: Secondary | ICD-10-CM | POA: Diagnosis not present

## 2024-01-03 DIAGNOSIS — M545 Low back pain, unspecified: Secondary | ICD-10-CM | POA: Diagnosis not present

## 2024-01-03 DIAGNOSIS — K5909 Other constipation: Secondary | ICD-10-CM | POA: Diagnosis not present

## 2024-01-03 DIAGNOSIS — E039 Hypothyroidism, unspecified: Secondary | ICD-10-CM

## 2024-01-03 DIAGNOSIS — N1831 Chronic kidney disease, stage 3a: Secondary | ICD-10-CM

## 2024-01-03 MED ORDER — LIDOCAINE 5 % EX PTCH: 1.0000 | MEDICATED_PATCH | CUTANEOUS | 0 refills | Status: AC

## 2024-01-03 NOTE — Patient Instructions (Signed)
 Recommend get the Shingles vaccine at the pharmacy.

## 2024-01-04 ENCOUNTER — Ambulatory Visit: Payer: Self-pay | Admitting: Family Medicine

## 2024-01-04 ENCOUNTER — Encounter: Payer: Self-pay | Admitting: Family Medicine

## 2024-01-04 DIAGNOSIS — K5909 Other constipation: Secondary | ICD-10-CM | POA: Insufficient documentation

## 2024-01-04 DIAGNOSIS — E039 Hypothyroidism, unspecified: Secondary | ICD-10-CM

## 2024-01-04 NOTE — Progress Notes (Signed)
 Call patient: Kidney function looks a little better this time.  Just a little bit of protein in the urine and doing a keep an eye on that plan to recheck again in 6 months.  Thyroid  under 1 so I do want a keep an eye on that as well and plan to recheck again in 3 months instead of 6 months.  Vitamin D  still low but it is coming up so please continue supplementation.

## 2024-01-05 LAB — PTH, INTACT AND CALCIUM: PTH: 56 pg/mL (ref 15–65)

## 2024-01-05 LAB — CMP14+EGFR
ALT: 14 IU/L (ref 0–32)
AST: 21 IU/L (ref 0–40)
Albumin: 4.4 g/dL (ref 3.8–4.8)
Alkaline Phosphatase: 87 IU/L (ref 49–135)
BUN/Creatinine Ratio: 17 (ref 12–28)
BUN: 17 mg/dL (ref 8–27)
Bilirubin Total: 0.4 mg/dL (ref 0.0–1.2)
CO2: 20 mmol/L (ref 20–29)
Calcium: 10.2 mg/dL (ref 8.7–10.3)
Chloride: 103 mmol/L (ref 96–106)
Creatinine, Ser: 0.98 mg/dL (ref 0.57–1.00)
Globulin, Total: 2.7 g/dL (ref 1.5–4.5)
Glucose: 92 mg/dL (ref 70–99)
Potassium: 4.4 mmol/L (ref 3.5–5.2)
Sodium: 140 mmol/L (ref 134–144)
Total Protein: 7.1 g/dL (ref 6.0–8.5)
eGFR: 58 mL/min/1.73 — ABNORMAL LOW (ref >59)

## 2024-01-05 LAB — MICROALBUMIN / CREATININE URINE RATIO
Creatinine, Urine: 56.1 mg/dL
Microalb/Creat Ratio: 38 mg/g{creat} — ABNORMAL HIGH (ref 0–29)
Microalbumin, Urine: 21.3 ug/mL

## 2024-01-05 LAB — VITAMIN D 25 HYDROXY (VIT D DEFICIENCY, FRACTURES): Vit D, 25-Hydroxy: 30.2 ng/mL (ref 30.0–100.0)

## 2024-01-05 LAB — TSH: TSH: 0.345 u[IU]/mL — ABNORMAL LOW (ref 0.450–4.500)

## 2024-01-07 ENCOUNTER — Telehealth: Payer: Self-pay

## 2024-01-07 ENCOUNTER — Other Ambulatory Visit (HOSPITAL_COMMUNITY): Payer: Self-pay

## 2024-01-07 NOTE — Telephone Encounter (Signed)
 Pharmacy Patient Advocate Encounter  Received notification from [facility] that Prior Authorization for Lidocaine 5% patches has been DENIED.  See denial reason below. No denial letter attached in CMM. Will attach denial letter to Media tab once received.   PA #/Case ID/Reference #: [id number]

## 2024-01-07 NOTE — Telephone Encounter (Signed)
 Please let patient know that the lidocaine patches were denied so if she wants to try some of the over-the-counter versions they can still be helpful.

## 2024-01-07 NOTE — Telephone Encounter (Signed)
 Pharmacy Patient Advocate Encounter   Received notification from CoverMyMeds that prior authorization for Lidocaine 5% patches is required/requested.   Insurance verification completed.   The patient is insured through [facility].   Per test claim: PA required; PA submitted to above mentioned insurance via Latent Key/confirmation #/EOC [id number] Status is pending

## 2024-01-08 NOTE — Telephone Encounter (Signed)
 Patient informed.

## 2024-01-08 NOTE — Progress Notes (Signed)
 Call patient: Parathyroid hormone level looks good.

## 2024-02-14 ENCOUNTER — Other Ambulatory Visit: Payer: Self-pay | Admitting: Family Medicine

## 2024-02-14 DIAGNOSIS — E039 Hypothyroidism, unspecified: Secondary | ICD-10-CM

## 2024-03-27 ENCOUNTER — Other Ambulatory Visit: Payer: Self-pay | Admitting: Family Medicine

## 2024-03-27 DIAGNOSIS — M5416 Radiculopathy, lumbar region: Secondary | ICD-10-CM

## 2024-03-27 DIAGNOSIS — Z9889 Other specified postprocedural states: Secondary | ICD-10-CM

## 2024-07-03 ENCOUNTER — Ambulatory Visit: Admitting: Family Medicine
